# Patient Record
Sex: Female | Born: 1965 | Race: White | Hispanic: No | Marital: Married | Smoking: Never smoker
Health system: Southern US, Community
[De-identification: ages and names within clinical notes are randomized; demographics above are authoritative.]

## PROBLEM LIST (undated history)

## (undated) DIAGNOSIS — F410 Panic disorder [episodic paroxysmal anxiety] without agoraphobia: Secondary | ICD-10-CM

## (undated) DIAGNOSIS — I10 Essential (primary) hypertension: Secondary | ICD-10-CM

## (undated) DIAGNOSIS — Z8489 Family history of other specified conditions: Secondary | ICD-10-CM

## (undated) DIAGNOSIS — F419 Anxiety disorder, unspecified: Secondary | ICD-10-CM

## (undated) DIAGNOSIS — C801 Malignant (primary) neoplasm, unspecified: Secondary | ICD-10-CM

## (undated) DIAGNOSIS — I739 Peripheral vascular disease, unspecified: Secondary | ICD-10-CM

## (undated) DIAGNOSIS — J302 Other seasonal allergic rhinitis: Secondary | ICD-10-CM

## (undated) DIAGNOSIS — M1990 Unspecified osteoarthritis, unspecified site: Secondary | ICD-10-CM

## (undated) DIAGNOSIS — K219 Gastro-esophageal reflux disease without esophagitis: Secondary | ICD-10-CM

## (undated) HISTORY — PX: ANTERIOR FUSION CERVICAL SPINE: SUR626

## (undated) HISTORY — DX: Essential (primary) hypertension: I10

## (undated) HISTORY — PX: BREAST SURGERY: SHX581

---

## 1997-05-10 ENCOUNTER — Ambulatory Visit (HOSPITAL_COMMUNITY): Admission: RE | Admit: 1997-05-10 | Discharge: 1997-05-10 | Payer: Self-pay | Admitting: Obstetrics & Gynecology

## 1998-04-28 ENCOUNTER — Other Ambulatory Visit: Admission: RE | Admit: 1998-04-28 | Discharge: 1998-04-28 | Payer: Self-pay | Admitting: Obstetrics & Gynecology

## 1998-08-17 ENCOUNTER — Encounter (INDEPENDENT_AMBULATORY_CARE_PROVIDER_SITE_OTHER): Payer: Self-pay | Admitting: Specialist

## 1998-08-17 ENCOUNTER — Other Ambulatory Visit: Admission: RE | Admit: 1998-08-17 | Discharge: 1998-08-17 | Payer: Self-pay | Admitting: Gynecology

## 1999-03-09 ENCOUNTER — Other Ambulatory Visit: Admission: RE | Admit: 1999-03-09 | Discharge: 1999-03-09 | Payer: Self-pay | Admitting: Gynecology

## 1999-11-27 ENCOUNTER — Other Ambulatory Visit: Admission: RE | Admit: 1999-11-27 | Discharge: 1999-11-27 | Payer: Self-pay | Admitting: Obstetrics & Gynecology

## 2002-08-07 ENCOUNTER — Other Ambulatory Visit: Admission: RE | Admit: 2002-08-07 | Discharge: 2002-08-07 | Payer: Self-pay | Admitting: Obstetrics & Gynecology

## 2004-04-10 ENCOUNTER — Other Ambulatory Visit: Admission: RE | Admit: 2004-04-10 | Discharge: 2004-04-10 | Payer: Self-pay | Admitting: Obstetrics & Gynecology

## 2005-11-08 ENCOUNTER — Ambulatory Visit (HOSPITAL_COMMUNITY): Admission: RE | Admit: 2005-11-08 | Discharge: 2005-11-08 | Payer: Self-pay | Admitting: Obstetrics & Gynecology

## 2005-11-20 ENCOUNTER — Ambulatory Visit (HOSPITAL_COMMUNITY): Admission: RE | Admit: 2005-11-20 | Discharge: 2005-11-20 | Payer: Self-pay | Admitting: Obstetrics & Gynecology

## 2007-06-18 ENCOUNTER — Emergency Department (HOSPITAL_COMMUNITY): Admission: EM | Admit: 2007-06-18 | Discharge: 2007-06-18 | Payer: Self-pay | Admitting: Emergency Medicine

## 2007-06-19 ENCOUNTER — Ambulatory Visit (HOSPITAL_COMMUNITY): Admission: RE | Admit: 2007-06-19 | Discharge: 2007-06-19 | Payer: Self-pay | Admitting: Gastroenterology

## 2007-07-09 ENCOUNTER — Inpatient Hospital Stay (HOSPITAL_COMMUNITY): Admission: AD | Admit: 2007-07-09 | Discharge: 2007-07-13 | Payer: Self-pay | Admitting: Gastroenterology

## 2007-07-09 HISTORY — PX: CHOLECYSTECTOMY: SHX55

## 2007-07-10 ENCOUNTER — Encounter (INDEPENDENT_AMBULATORY_CARE_PROVIDER_SITE_OTHER): Payer: Self-pay | Admitting: General Surgery

## 2007-08-07 ENCOUNTER — Ambulatory Visit (HOSPITAL_COMMUNITY): Admission: RE | Admit: 2007-08-07 | Discharge: 2007-08-07 | Payer: Self-pay | Admitting: General Surgery

## 2008-01-09 HISTORY — PX: DIAGNOSTIC LAPAROSCOPY: SUR761

## 2008-05-25 ENCOUNTER — Encounter (INDEPENDENT_AMBULATORY_CARE_PROVIDER_SITE_OTHER): Payer: Self-pay | Admitting: Obstetrics & Gynecology

## 2008-05-25 ENCOUNTER — Observation Stay (HOSPITAL_COMMUNITY): Admission: AD | Admit: 2008-05-25 | Discharge: 2008-05-26 | Payer: Self-pay | Admitting: Obstetrics & Gynecology

## 2008-05-25 HISTORY — PX: DIAGNOSTIC LAPAROSCOPY: SUR761

## 2009-02-15 ENCOUNTER — Ambulatory Visit: Payer: Self-pay | Admitting: Internal Medicine

## 2009-12-15 ENCOUNTER — Observation Stay (HOSPITAL_COMMUNITY): Admission: EM | Admit: 2009-12-15 | Discharge: 2009-02-15 | Payer: Self-pay | Admitting: Emergency Medicine

## 2010-03-29 LAB — BASIC METABOLIC PANEL
Chloride: 102 mEq/L (ref 96–112)
GFR calc non Af Amer: >60 mL/min (ref >60)
Potassium: 2.9 mEq/L — ABNORMAL LOW (ref 3.5–5.1)
Sodium: 138 mEq/L (ref 135–145)

## 2010-03-29 LAB — DIFFERENTIAL
Eosinophils Absolute: 0.1 10*3/uL (ref 0.0–0.7)
Eosinophils Relative: 2 % (ref 0–5)
Lymphocytes Relative: 35 % (ref 12–46)
Lymphs Abs: 2.5 10*3/uL (ref 0.7–4.0)
Monocytes Absolute: 0.5 10*3/uL (ref 0.1–1.0)
Monocytes Relative: 7 % (ref 3–12)

## 2010-03-29 LAB — CK TOTAL AND CKMB (NOT AT ARMC)
CK, MB: 1 ng/mL (ref 0.3–4.0)
Relative Index: INVALID (ref 0.0–2.5)
Total CK: 98 U/L (ref 7–177)

## 2010-03-29 LAB — POCT CARDIAC MARKERS: Troponin i, poc: <0.05 ng/mL (ref 0.00–0.09)

## 2010-03-29 LAB — D-DIMER, QUANTITATIVE: D-Dimer, Quant: 0.27 ug/mL-FEU (ref 0.00–0.48)

## 2010-03-29 LAB — CBC
HCT: 37.3 % (ref 36.0–46.0)
Hemoglobin: 12.8 g/dL (ref 12.0–15.0)
MCV: 83.2 fL (ref 78.0–100.0)
RBC: 4.48 MIL/uL (ref 3.87–5.11)
WBC: 6.9 10*3/uL (ref 4.0–10.5)

## 2010-03-29 LAB — TSH: TSH: 2.143 u[IU]/mL (ref 0.350–4.500)

## 2010-03-29 LAB — TROPONIN I: Troponin I: 0.02 ng/mL (ref 0.00–0.06)

## 2010-04-18 LAB — CBC
HCT: 26.3 % — ABNORMAL LOW (ref 36.0–46.0)
HCT: 32.3 % — ABNORMAL LOW (ref 36.0–46.0)
Hemoglobin: 11.4 g/dL — ABNORMAL LOW (ref 12.0–15.0)
Hemoglobin: 9.2 g/dL — ABNORMAL LOW (ref 12.0–15.0)
Hemoglobin: 9.7 g/dL — ABNORMAL LOW (ref 12.0–15.0)
MCHC: 35.1 g/dL (ref 30.0–36.0)
MCHC: 35.3 g/dL (ref 30.0–36.0)
MCHC: 35.5 g/dL (ref 30.0–36.0)
MCV: 87 fL (ref 78.0–100.0)
MCV: 88.1 fL (ref 78.0–100.0)
Platelets: 201 10*3/uL (ref 150–400)
Platelets: 210 10*3/uL (ref 150–400)
RBC: 3.71 MIL/uL — ABNORMAL LOW (ref 3.87–5.11)
RDW: 15 % (ref 11.5–15.5)
RDW: 15.1 % (ref 11.5–15.5)
WBC: 8.3 10*3/uL (ref 4.0–10.5)

## 2010-05-23 NOTE — Op Note (Signed)
[name], [name]               ACCOUNT NO.:  [id number]   MEDICAL RECORD NO.:  [id number]          PATIENT TYPE:  INP   LOCATION:  5120                         FACILITY:  [facility]   PHYSICIAN:  Ollen Gross. Vernell Morgans, M.D. DATE OF BIRTH:  06-16-1965   DATE OF PROCEDURE:  07/10/2007  DATE OF DISCHARGE:                               OPERATIVE REPORT   PREOPERATIVE DIAGNOSIS:  Gallstones.   POSTOPERATIVE DIAGNOSIS:  Gallstones.   PROCEDURE:  Laparoscopic cholecystectomy with intraoperative  cholangiogram.   SURGEON:  Ollen Gross. Vernell Morgans, MD   ASSISTANT:  Marta Lamas. Lindie Spruce, MD   ANESTHESIA:  General endotracheal.   PROCEDURE:  After informed consent was obtained, the patient was brought  to the operating room and placed in the supine position on the operating  table.  After adequate induction of general anesthesia, the patient's  abdomen was prepped with Betadine and draped in usual sterile manner.  The area below the umbilicus was infiltrated with 0.25% Marcaine.  A  small incision was made with an 11-blade knife.  This incision was  carried down through the skin and subcutaneous tissue bluntly with a  hemostat and [employer] retractors until the linea alba was identified.  The linea alba was incised with a 15-blade knife and each side was  grasped Kocher clamps and elevated anteriorly.  The preperitoneal space  was then probed bluntly with a hemostat until the peritoneum was opened  access was gained to the abdominal cavity.  A 0-Vicryl pursestring  stitch was placed in the fascia around the opening.  A Hasson cannula  was placed through the opening and anchored in place at the previous  with 0-Vicryl pursestring stitch.  The abdomen was then insufflated with  carbon dioxide without difficulty.  The laparoscope was inserted through  the Hasson cannula and the right upper quadrant was inspected.  The dome  of the gallbladder and liver were readily identified.  The epigastric  region was  then infiltrated with 0.25% Marcaine.  A small incision was  made with a 15-blade knife.  A 10-mm port was placed bluntly through  this incision into the abdominal cavity under direct vision.  Sites were  then chosen laterally on the right side of the abdomen with placement of  5-mm ports.  Each of these areas were then infiltrated with 0.25%  Marcaine.  Small stab incisions were made with a 15-blade knife.  A 5-mm  ports were placed through these incisions into the abdominal cavity  under direct vision.  Blunt grasper was placed through the lateral-most  5-mm port and used to grasp dome of gallbladder and elevated anteriorly  and superiorly.  Another blunt grasper was placed through the other 5-mm  port and used to retract on the body and neck of the gallbladder.  Some  omental adhesions to the body gallbladder was taken down by blunt and  sharp dissection with the dissector and electrocautery.  Once this was  accomplished, the peritoneal reflection of the gallbladder neck was  opened with electrocautery.  Blunt dissection was then carried out in  this area until  the gallbladder neck cystic duct junction was readily  identified and a good window was created.  A single clip was placed in  the gallbladder neck.  A small ductotomy was made just below the clip.  A 14-gauge Angiocath was placed percutaneously through the anterior  abdominal wall under direct vision.  A Reddick cholangiogram catheter  was placed through the Angiocath and flushed.  The Reddick catheter was  then placed within the cystic duct and anchored in place with the clip.  Cholangiogram was obtained that showed no filling defects, good emptying  in duodenum and adequate length on the cystic duct, anchoring clip and  catheters were removed from the patient.  Three clips placed proximally  on the cystic duct and duct was divided between the 2 sets of clips.  Posterior to this, the cystic artery was identified and again  dissected  bluntly in a circumferential manner until a good window was created.  Two clips placed proximally and one distally on the artery and the  artery was divided between the two.  Next, a laparoscopic hook cautery  device was used to separate the gallbladder from liver bed.  Prior to  completely detaching the gallbladder from liver bed, the liver bed was  inspected and some small bleeding points were coagulated with  electrocautery until the area was completely hemostatic.  The  gallbladder was then detached from the rest away from liver bed without  difficulty.  A laparoscopic bag was inserted through the epigastric  port.  The gallbladder was placed in the bag and bag was sealed.  The  abdomen was then irrigated with copious amounts of saline to the  effluent was clear.  The laparoscope was then moved to the epigastric  port.  A gallbladder grasper was placed through the Hasson cannula used  to grasp and open the bag.  The bag with the gallbladder was removed  through the infraumbilical port without difficulty.  The fascial defect  was closed a previous used Vicryl pursestring stitch as well as with  another figure-of-eight 0-Vicryl stitch.  The rest of the ports were  removed under direct vision and found to be hemostatic.  The gas was  allowed to escape.  The skin incisions were all closed with interrupted  4-0 Monocryl and subcuticular stitches.  Dermabond dressings were  applied.  The patient tolerated the procedure well.  At the end of the  case all needle, sponge, and instrument counts were correct.  The  patient was then awakened and taken to recovery in stable condition.      Ollen Gross. Vernell Morgans, M.D.  Electronically Signed     PST/MEDQ  D:  07/12/2007  T:  07/13/2007  Job:  [id number]

## 2010-05-23 NOTE — Consult Note (Signed)
[name], [name]               ACCOUNT NO.:  [id number]   MEDICAL RECORD NO.:  [id number]          PATIENT TYPE:  INP   LOCATION:  5120                         FACILITY:  [facility]   PHYSICIAN:  Ollen Gross. Vernell Morgans, M.D. DATE OF BIRTH:  [birth year]   DATE OF CONSULTATION:  07/09/2007  DATE OF DISCHARGE:                                 CONSULTATION   REQUESTING PHYSICIAN:  Dr. Elnoria Howard.   CONSULTING SURGEON:  Dr. Carolynne Edouard.   REASON FOR CONSULTATION:  Symptomatic cholelithiasis.   HISTORY OF PRESENT ILLNESS:  This is a 45 year old white female with no  significant past medical history, who began having gallbladder attacks  in April of this year.  At that time, she tried to ignore these and kept  working.  However, her pain became bad enough that on June 18, 2007, she  presented to the emergency department, and her LFTs were increased with  a total bilirubin of approximately 2.3.  At that time, Dr. Elnoria Howard did a  ERCP and removed a common bile duct stone within the gallbladder.  Since  then, the patient has been seen by Dr. Daphine Deutscher at our office and an  outpatient elective cholecystectomy is currently being tried to set up.  However since that visit, the patient has been continuing to have an  increasing amount of pain.  She states that at this time she cannot work  with this much pain.  At that time, the patient called Dr. Elnoria Howard and due  to her increasing pain with nausea as well as the dry heave, she was  admitted.  At this time, the patient does not have any new labs.  We  were asked to see the patient for surgical intervention.   REVIEW OF SYSTEMS:  See HPI.  Otherwise all other systems are negative.   FAMILY HISTORY:  Noncontributory.   PAST MEDICAL HISTORY:  Recent diagnosis of hypertension.   PAST SURGICAL HISTORY:  1. 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  2. Two laparoscopies by her OB/GYN.   SOCIAL HISTORY:  The patient has been married for 18 years and does not  have  any children.  She denies tobacco, alcohol, or drugs.   ALLERGIES:  Appears to be NKDA.   MEDICATIONS:  1. The patient has been taking Percocet on a p.r.n. for this most      recent pain.  2. Phenergan as needed for nausea.   PHYSICAL EXAMINATION:  GENERAL:  This is a very pleasant 45 year old  white female who is well developed and well nourished lying in bed in no  acute distress.  VITAL SIGNS:  Temperature has not been taken yet, pulse of 58,  respirations were 16, blood pressure is 172/94.  EYES:  Sclerae noninjected.  Pupils are equal, round and reactive to  light.  EARS, NOSE, MOUTH, AND THROAT:  Ears and nose, no obvious masses or  lesions.  No rhinorrhea.  Mouth is pink and moist.  Throat shows no  exudate.  NECK:  Supple.  Trachea is midline.  No thyromegaly.  HEART:  Regular rate  and rhythm.  Normal S1 and S2.  No murmurs,  gallops, or rubs are noted.  +2 carotid and pedal pulses bilaterally.  LUNGS:  Clear to auscultation bilaterally with no wheezes, rhonchi, or  rales noted.  Respiratory effort is nonlabored.  CHEST:  Symmetrical.  ABDOMEN:  Soft and tender in the right upper quadrant.  The patient does  have active bowel sounds and is nondistended.  However, she is somewhat  obese.  Otherwise, the patient does not have any hernias or masses  noted.  However, she does have an infraumbilical scar from her prior  laparoscopies.  MUSCULOSKELETAL:  All 4 extremities are symmetrical with no cyanosis,  clubbing, or edema.  Skin is warm and dry with no obvious masses,  lesions, or rashes.  NEURO:  Cranial nerves 2 through 12 appear to be grossly intact.  Deep  tendon reflex exam is deferred at this time.  PSYCH:  The patient is alert and oriented x3 with an appropriate affect.   Labs and diagnostics are pending.   IMPRESSION:  1. Symptomatic cholelithiasis.  2. Status post recent endoscopic retrograde cholangiopancreatography      done on June 19, 2007.  3.  Hypertension.   PLAN:  At this time, we will allow the patient to have clear liquids  today.  However, if this begins to make her sick of her stomach, we will  back her off to n.p.o.  Otherwise will make the patient n.p.o. after  midnight in case surgical intervention can be done tomorrow.  Otherwise  at this time since the labs have been drawn, we will order CBC, CMET,  amylase, and lipase.  At that time if her liver function tests come back  normal, we will probably go ahead and proceed with a laparoscopic  cholecystectomy tomorrow.  However, if her LFTs come back abnormal, we  may need to get another ultrasound of the gallbladder, a HIDA scan or  possibly another ERCP pending the lab values.  Otherwise at this time,  we will go ahead and arrange for permit to be signed for a laparoscopic  cholecystectomy with intraoperative cholangiogram and possible open by  Dr. Carolynne Edouard.  Otherwise, I will also start the patient on Diovan 80 mg p.o.  daily for her hypertension.      Letha Cape, PA      Ollen Gross. Vernell Morgans, M.D.  Electronically Signed    KEO/MEDQ  D:  07/09/2007  T:  07/10/2007  Job:  [id number]   cc:   Jordan Hawks. Elnoria Howard, MD

## 2010-05-23 NOTE — H&P (Signed)
[name], [name]               ACCOUNT NO.:  [id number]   MEDICAL RECORD NO.:  [id number]          PATIENT TYPE:  INP   LOCATION:  5120                         FACILITY:  [facility]   PHYSICIAN:  Jordan Hawks. Elnoria Howard, MD    DATE OF BIRTH:  [birth year]   DATE OF ADMISSION:  07/09/2007  DATE OF DISCHARGE:                              HISTORY & PHYSICAL   REASON FOR ADMISSION:  Symptomatic gallstone, gallbladder pain.   SURGEON:  Thornton Park. Daphine Deutscher, MD   HISTORY OF PRESENT ILLNESS:  This is a 45 year old female, who initially  presented on June 18, 2007, with complaints of abdominal pain in the  right upper quadrant.  At that time, she presented to the emergency room  and was noted to have elevated transaminases consistent with an  obstructive picture.  A right upper quadrant ultrasound was performed  and was significant for gallstones, and subsequently, a surgical  consultation was requested from the ER.  At that time, Dr. Daphine Deutscher felt  that she would be better served with an ERCP before a laparoscopic  cholecystectomy.  Subsequently, she was sent to Dr. Haywood Pao office, and  she was evaluated, and subsequently, she underwent an ERCP, which was  performed without any complications.  At that time, there was no  evidence of any biliary ductal stones, and the CBD was noted to be of  normal caliber.  However, with contrast injection, a significant amount  of small stones were noted in the right upper quadrant.  All current  workup was negative for any evidence of chololithiasis, and after the  procedure, she did not have any complications from the ERCP; however,  she continued to complain of right upper quadrant pain.  She was  provided with some pain medications; unfortunately, they caused her  significant amount of side effects in regards to altered mental status  and with the use of medications, she was not able to work.  Subsequently, she was recommended that if her pain continued to worse,  then she could present to the emergency room for further evaluation and  treatment, and possibly advance her cholecystectomy time.  Arrangements  were being made for her to be seen by Dr. Daphine Deutscher much sooner than  previously scheduled, and she was able to be seen by him on July 07, 2007.  At that point, it was felt that there was no eminent need for a  cholecystectomy and she should be treated for reflux disease.  She  subsequently followed up with Dr. Elnoria Howard in the office and she was started  on Kapidex; however, there was a significant amount of right upper  quadrant tenderness and also associated nausea with her symptoms.  No  reports of any fever.  Blood work was obtained during this office visit  and was noted to be normal.  Upon followup via the phone, the patient  continued to complain of having right upper quadrant pain, she was not  able to function normally and subsequently, she was offered an admission  to the hospital to help expedite the removal of her gallbladder.   PAST  MEDICAL HISTORY:  As stated above.   PAST SURGICAL HISTORY:  As stated above.   FAMILY HISTORY:  Noncontributory.   SOCIAL HISTORY:  The patient works as a Nature conservation officer at [employer].  No  alcohol, tobacco, or illicit drug use.   REVIEW OF SYSTEMS:  As stated above in the history of present illness,  otherwise negative.   PHYSICAL EXAMINATION:  VITAL SIGNS:  Blood pressure is 156/98, heart  rate is 58, temperature is 97.6, and respirations 16.  GENERAL:  The patient is in no acute distress, but she is uncomfortable  in appearance.  HEENT:  Normocephalic and atraumatic.  Extraocular muscles intact.  NECK:  Supple.  No lymphadenopathy.  LUNGS:  Clear to auscultation bilaterally.  CARDIOVASCULAR:  Regular rate and rhythm.  ABDOMEN:  Obese, soft, and tender in the right upper quadrant and  epigastric region.  No rebound or rigidity.  Positive bowel sounds.  EXTREMITIES:  No clubbing, cyanosis, or edema.    LABORATORY DATA:  White blood cell count 6.1, hemoglobin 11.7, MCV is  83.2, and platelets 237.  Sodium 141, potassium 3.6, chloride 109, CO2  25, glucose 89, BUN 6, and creatinine 0.7.  Total bilirubin 1.2, alk  phos 89, AST 21, ALT 21, amylase is 23, and lipase is 21.   IMPRESSION:  Symptomatic cholelithiasis.  Because of the persistent pain  and her nausea types of symptoms, I feel that a cholecystectomy is  required at this time.  It does not appear that she can wait several  more weeks for her surgery.  Dr. Carolynne Edouard has consulted on the patient.  It  appears that she will undergo the surgery tomorrow.   PLAN:  1. Nothing per os after midnight.  2. Anticipate cholecystectomy tomorrow.      Jordan Hawks Elnoria Howard, MD  Electronically Signed     PDH/MEDQ  D:  07/09/2007  T:  07/10/2007  Job:  [id number]   cc:   Thornton Park Daphine Deutscher, MD  Ollen Gross. Vernell Morgans, M.D.

## 2010-05-23 NOTE — Discharge Summary (Signed)
[name], [name]               ACCOUNT NO.:  [id number]   MEDICAL RECORD NO.:  [id number]          PATIENT TYPE:  OUT   LOCATION:  XRAY                         FACILITY:  [facility]   PHYSICIAN:  Ollen Gross. Vernell Morgans, M.D. DATE OF BIRTH:  [birth year]   DATE OF ADMISSION:  08/07/2007  DATE OF DISCHARGE:                               DISCHARGE SUMMARY   DISCHARGE DIAGNOSES:  Cholelithiasis and cholecystitis.   SURGEON:  Ollen Gross. Vernell Morgans, MD   HISTORY OF PHYSICAL:  Please see the original history and physical for  full details.   HOSPITAL COURSE:  The patient was admitted to the hospital with  persistent right upper quadrant pain which was felt to be secondary to  symptomatic cholelithiasis.  She had undergone an ERCP several weeks ago  which was negative for any common bile duct stones.  Because of the  persistence of her pain and symptoms, she was admitted to the hospital  for emergent laparoscopic cholecystectomy.  The patient was evaluated by  Dr. Carolynne Edouard and it was felt that she would be a good candidate to undergo  the procedure.  She was subsequently taken for surgery on the day after  admission and then the surgery was performed without any complications.  After the surgery, she did complain of having a significant amount of  abdominal pain which was felt to be incisional in nature; however, on  postop day #2, she did complain of having a worsening of her abdominal  pain.  A CT scan was performed and was negative for any bile duct bleeds  or any other abnormalities.  On postop day #3, the patient felt much  better and her pain had resolved at that time and was felt that she  would be able to be discharged safely.  Blood work during this time was  also negative for any abnormalities.  The patient is follow up with Dr.  Carolynne Edouard as previously scheduled.      Jordan Hawks Elnoria Howard, MD   Electronically Signed     ______________________________  Ollen Gross. Vernell Morgans, M.D.    PDH/MEDQ  D:   08/07/2007  T:  08/08/2007  Job:  [id number]   cc:   Ollen Gross. Vernell Morgans, M.D.

## 2010-05-23 NOTE — Op Note (Signed)
NAMEDAIJANAE, RAFALSKI               ACCOUNT NO.:  [id number]   MEDICAL RECORD NO.:  [id number]          PATIENT TYPE:  INP   LOCATION:  9308                          FACILITY:  [facility]   PHYSICIAN:  Freddy Finner, M.D.   DATE OF BIRTH:  11/07/1965   DATE OF PROCEDURE:  05/25/2008  DATE OF DISCHARGE:                               OPERATIVE REPORT   PREOPERATIVE DIAGNOSIS:  Ruptured ectopic pregnancy.   POSTOPERATIVE DIAGNOSIS:  Ruptured ectopic pregnancy with ruptured right  fallopian tube from ectopic pregnancy and intra-abdominal hemorrhage.   OPERATION PERFORMED:  Laparoscopy, partial right salpingectomy with  resection of ectopic pregnancy, irrigation and aspiration of bloody  clots from the abdominal and pelvic cavities.   SURGEON:  Freddy Finner, M.D.   ANESTHESIA:  General endotracheal.   ESTIMATED BLOOD LOSS:  Estimated total blood loss including that in the  abdomen was in the range of 300 to 500 mL.   INTRAOPERATIVE COMPLICATIONS:  None.   INDICATIONS FOR PROCEDURE:  The patient is a 45 year old who was found  to have a positive early pregnancy test and had an ultrasound on May 23, 2008 which showed an empty uterus, showed a cystic structure in the  right adnexa, most consistent with a corpus luteum with no definite  evidence of gestational sac either in the uterus or the tube.  She was  given an ectopic warning.  She presented on the evening of surgery with  severe abdominal pain which started lower and generalized to the upper  abdomen to the point that she was having some difficulty breathing.  __________an abdominopelvic ultrasound showed a large amount of clots  and blood in the abdomen.  It was elected to bring her to the operating  room.   DESCRIPTION OF PROCEDURE:  She was there placed under adequate general  endotracheal anesthesia, placed in dorsal lithotomy position.  The  abdomen, perineum and vagina were prepped in the usual fashion.  Hulka  tenaculum  was attached to the cervix under direct visualization.  Foley  catheter was placed to evacuate the bladder.  Sterile drapes were  applied.  Two incisions were made, one in the umbilicus and one just  above the symphysis.  An 11 mm bladeless disposable trocar was  introduced at the umbilicus.  Direct inspection revealed adequate  placement with no evidence of injury on entry.  Pneumoperitoneum was  allowed to accumulate.  A second incision was made just above the  symphysis and through this, a 10-11 bladed trocar was also placed under  direct visualization.  A large suction coil-tip was used to evacuate the  bloody material.  The fallopian tube on the right was grasped with a  ring forceps and elevated.  It was obvious there was an ampullary  ruptured ectopic pregnancy with active arterial bleeding.  Bipolar  coagulation forceps were used through the operating channel of the  laparoscope to seal and divide the tube above and below the ectopic with  resection of the distal half of the tube.  This was accomplished without  difficulty.  Sharp division freed the  tube and it was delivered through  the operating channel of the laparoscope.  A repeated inspection  revealed complete hemostasis at this site.  The right ovary itself  appeared to be normal.  The uterus seemed to be slightly enlarged but  overall the contour was normal.  The left fallopian tube and ovary were  inspected and were normal.  Two more liters of irrigating solution were  then used to irrigate the abdominal cavity and to remove blood and  clots.  80% or more of the bloody material was removed.  Hemostasis was  complete.  At this point the irrigating solution was aspirated from the  abdomen.  The instruments were removed.  The skin incisions were closed  with interrupted subcuticular sutures of 3-0 Dexon.  Steri-Strips were  applied to the lower incision.  0.5% Marcaine was injected into the  incision sites for postoperative  analgesia.  The patient was awakened  and taken to the recovery room in satisfactory condition.  She will be  admitted overnight for observation with likely discharge on May 26, 2008.      Freddy Finner, M.D.  Electronically Signed     WRN/MEDQ  D:  05/26/2008  T:  05/26/2008  Job:  [id number]

## 2010-05-23 NOTE — Discharge Summary (Signed)
[name], [name]               ACCOUNT NO.:  [id number]   MEDICAL RECORD NO.:  [id number]          PATIENT TYPE:  INP   LOCATION:  5120                         FACILITY:  [facility]   PHYSICIAN:  Jordan Hawks. Elnoria Howard, MD    DATE OF BIRTH:  [birth year]   DATE OF ADMISSION:  07/09/2007  DATE OF DISCHARGE:  07/13/2007                               DISCHARGE SUMMARY   DISCHARGE DIAGNOSIS:  Symptomatic cholelithiasis.   SURGEON:  Ollen Gross. Vernell Morgans, MD   HISTORY OF PRESENT ILLNESS:  Please see the original H and P for full  details.   HOSPITAL COURSE:  The patient was admitted to the hospital with  symptomatic gallbladder pain.  She was evaluated by surgery.  On the  second hospital day, she was taken to the operating room for a  laparoscopic cholecystectomy, which was performed without any  complications.  Postoperatively, she had a significant amount of right  lower quadrant pain, which was more than expected from a laparoscopic  cholecystectomy.  A HIDA scan was ordered and this was negative for any  bile leak.  Because of the persistence of her abdominal pain, a CT scan  of abdomen was also performed.  The CT scan was also negative for any  abnormalities.  Spontaneously, the pain did improve on its own and her  pain medication requirements markedly decreased.  She was also able to  tolerate p.o. in the meantime and ambulate without any difficulty.  On  the day of discharge, the patient had incisional pain but it was  markedly improved from her prior days from her surgery.   PLAN:  Plan at this time is to follow up with surgery, with Dr. Carolynne Edouard,  and pain medications per the Surgical Service.      Jordan Hawks Elnoria Howard, MD  Electronically Signed     PDH/MEDQ  D:  07/13/2007  T:  07/13/2007  Job:  [id number]   cc:   Ollen Gross. Vernell Morgans, M.D.

## 2010-10-05 LAB — COMPREHENSIVE METABOLIC PANEL
ALT: 195 — ABNORMAL HIGH
ALT: 24
AST: 500 — ABNORMAL HIGH
AST: 22
AST: 28
Albumin: 3 — ABNORMAL LOW
Albumin: 3.4 — ABNORMAL LOW
Albumin: 3.8
Alkaline Phosphatase: 159 — ABNORMAL HIGH
Alkaline Phosphatase: 89
BUN: 2 — ABNORMAL LOW
BUN: 5 — ABNORMAL LOW
BUN: 6
CO2: 23
CO2: 25
Calcium: 9.1
Calcium: 8.9
Calcium: 9.5
Chloride: 104
Chloride: 106
Chloride: 109
Creatinine, Ser: 0.73
Creatinine, Ser: 0.79
Creatinine, Ser: 0.83
GFR calc Af Amer: >60
GFR calc Af Amer: >60
GFR calc Af Amer: >60
GFR calc non Af Amer: >60
GFR calc non Af Amer: >60
Glucose, Bld: 119 — ABNORMAL HIGH
Glucose, Bld: 89
Glucose, Bld: 90
Potassium: 3.8
Potassium: 3.6
Sodium: 139
Sodium: 139
Sodium: 140
Total Bilirubin: 0.9
Total Bilirubin: 1.2
Total Bilirubin: 1.6 — ABNORMAL HIGH
Total Protein: 6.5
Total Protein: 6.8

## 2010-10-05 LAB — CBC
HCT: 34.7 — ABNORMAL LOW
HCT: 36.1
HCT: 37.7
Hemoglobin: 13.8
Hemoglobin: 11.7 — ABNORMAL LOW
Hemoglobin: 12
Hemoglobin: 12.9
MCHC: 34.3
MCHC: 33.7
MCHC: 34.1
MCV: 83.6
MCV: 84
MCV: 84.8
Platelets: 230
Platelets: 273
RBC: 4.93
RBC: 4.17
RDW: 15.2
RDW: 15.3
WBC: 6.5
WBC: 5.6
WBC: 5.8
WBC: 6.1

## 2010-10-05 LAB — DIFFERENTIAL
Basophils Relative: 0
Eosinophils Absolute: 0.1
Eosinophils Absolute: 0.1
Eosinophils Relative: 1
Eosinophils Relative: 2
Lymphs Abs: 1.4
Lymphs Abs: 2.5
Monocytes Relative: 9
Monocytes Relative: 8
Neutrophils Relative %: 57

## 2010-10-05 LAB — URINALYSIS, ROUTINE W REFLEX MICROSCOPIC
Glucose, UA: NEGATIVE
Ketones, ur: NEGATIVE
pH: 6.5

## 2010-10-05 LAB — AMYLASE: Amylase: 23 — ABNORMAL LOW

## 2010-10-05 LAB — LIPASE, BLOOD
Lipase: 18
Lipase: 21

## 2011-02-28 ENCOUNTER — Ambulatory Visit (INDEPENDENT_AMBULATORY_CARE_PROVIDER_SITE_OTHER): Payer: Self-pay | Admitting: Pulmonary Disease

## 2011-02-28 ENCOUNTER — Encounter: Payer: Self-pay | Admitting: Pulmonary Disease

## 2011-02-28 VITALS — BP 162/98 | HR 75 | Temp 99.2°F | Ht 66.5 in | Wt 242.6 lb

## 2011-02-28 DIAGNOSIS — R05 Cough: Secondary | ICD-10-CM

## 2011-02-28 NOTE — Patient Instructions (Signed)
You seem to have a post bronchitic cough this time different from your usual seasonal cough OK to take cough drops as needed DELSYM 2 tsp thrice daily x 2-3 weeks OK to take tessalon perles Qvar 80 1 puff twice daily -sample STOP taking Dulera Prednisone 20 mg x 1 week, then 10 mg x 1 week, then stop  Call back if no better in 1 week & we may have to use low dose codeine cough syrup

## 2011-02-28 NOTE — Assessment & Plan Note (Signed)
You seem to have a post bronchitic cough this time different from your usual seasonal cough OK to take cough drops as needed DELSYM 2 tsp thrice daily x 2-3 weeks OK to take tessalon perles Qvar 80 1 puff twice daily -sample STOP taking Dulera Prednisone 20 mg x 1 week, then 10 mg x 1 week, then stop  Call back if no better in 1 week & we may have to use low dose codeine cough syrup   

## 2011-02-28 NOTE — Progress Notes (Signed)
  Subjective:    Patient ID: [name], female    DOB: 1965/01/10, 46 y.o.   MRN: [id number]  HPI PCP - tisovec  45/F, never smoker for evaluation of chronic cough She has a seasonal cough once / twice a year that improves with zyrtec & tessalon. Shehas had this since her teens. Her last such episode was in nov that resolved with amoxcillin.  In the first week of Jan, she developed a sore throat & ear ache, her sister & mom were sick at the same time. This soon developed into a cough & hoarseness. She initially got IM solumedrol for this & Z-pak. When not improved, she was given dulera & avelox - hoarseness improved then got worse again. She c/o tremors & has stopped using dulera. She now has a mostly dry cough with minimal clear phlegm. This does wake her up from sleep. She gets through work with a bottle of water & vit C drops. CXr 2/13 wnl CT abdomen from '09 shows nml lung  Bases  - no bronchiectasis. CBC, BMET nml She denies childhood h/o asthma, no heartburn or sinus drainage    Review of Systems  Constitutional: Negative for fever and unexpected weight change.  HENT: Positive for ear pain and sore throat. Negative for nosebleeds, congestion, rhinorrhea, sneezing, trouble swallowing, dental problem, postnasal drip and sinus pressure.   Eyes: Negative for redness and itching.  Respiratory: Positive for cough. Negative for chest tightness, shortness of breath and wheezing.   Cardiovascular: Negative for palpitations and leg swelling.  Gastrointestinal: Negative for nausea and vomiting.  Genitourinary: Negative for dysuria.  Musculoskeletal: Negative for joint swelling.  Skin: Negative for rash.  Neurological: Positive for headaches.  Hematological: Does not bruise/bleed easily.  Psychiatric/Behavioral: Negative for dysphoric mood. The patient is not nervous/anxious.        Objective:   Physical Exam Gen. Pleasant, well-nourished, in no distress, normal affect ENT - no  lesions, no post nasal drip, no tonsillar enlargement Neck: No JVD, no thyromegaly, no carotid bruits Lungs: no use of accessory muscles, no dullness to percussion, clear without rales or rhonchi  Cardiovascular: Rhythm regular, heart sounds  normal, no murmurs or gallops, no peripheral edema Abdomen: soft and non-tender, no hepatosplenomegaly, BS normal. Musculoskeletal: No deformities, no cyanosis or clubbing Neuro:  alert, non focal        Assessment & Plan:

## 2011-03-01 ENCOUNTER — Telehealth: Payer: Self-pay | Admitting: Pulmonary Disease

## 2011-03-01 MED ORDER — PREDNISONE 10 MG PO TABS: ORAL_TABLET | ORAL | Status: DC

## 2011-03-01 NOTE — Telephone Encounter (Signed)
Called and spoke with pt. Pt states she was seen yesterday by RA and prescribed Prednisone but rx wasn't at pharmacy.   Reviewed pt's chart and rx was never sent- therefore sent rx to pt's pharmacy- [facility] on [address].

## 2011-03-05 ENCOUNTER — Telehealth: Payer: Self-pay | Admitting: Pulmonary Disease

## 2011-03-05 NOTE — Telephone Encounter (Signed)
I spoke with the [name] and she states her cough is 75% better. She is still on 2 tabs a day of prednisone a day.  She just wanted to give Dr. Vassie Loll an update. I will forward the message. Carron Curie, CMA

## 2011-03-06 NOTE — Telephone Encounter (Signed)
ok 

## 2011-03-22 ENCOUNTER — Ambulatory Visit: Payer: 59 | Admitting: Adult Health

## 2011-05-07 ENCOUNTER — Ambulatory Visit (INDEPENDENT_AMBULATORY_CARE_PROVIDER_SITE_OTHER): Payer: 59 | Admitting: Pulmonary Disease

## 2011-05-07 ENCOUNTER — Telehealth: Payer: Self-pay | Admitting: Pulmonary Disease

## 2011-05-07 ENCOUNTER — Encounter: Payer: Self-pay | Admitting: Pulmonary Disease

## 2011-05-07 VITALS — BP 150/102 | HR 89 | Temp 98.4°F | Ht 66.0 in | Wt 248.0 lb

## 2011-05-07 DIAGNOSIS — R05 Cough: Secondary | ICD-10-CM

## 2011-05-07 MED ORDER — TRAMADOL HCL 50 MG PO TABS: 50.0000 mg | ORAL_TABLET | Freq: Four times a day (QID) | ORAL | Status: AC | PRN

## 2011-05-07 NOTE — Assessment & Plan Note (Addendum)
The patient has a chronic cough that improved from her last visit here, but now has re\re escalated over the last 5 days.  She is not able to identify a precipitating event.  Her cough is clearly coming from her upper airway, and she is having cough paroxysms leading to significant regurgitation of stomach contents.  She also describes difficulty swallowing, and a feeling that her "food gets stuck".  She is also having some postnasal drip, but nothing to suggest definite sinusitis.  Her lungs are clear today, and she denies any increased shortness of breath when she is not coughing.  I would like to treat her aggressively for upper airway causes of cough, including an antihistamine, proton pump inhibitor, and behavioral therapies for cyclical coughing.  She unfortunately has complete intolerance for codeine and codeine-containing medications, and therefore will try tramadol to see if this helps.

## 2011-05-07 NOTE — Patient Instructions (Signed)
Take dexilant 60mg  one each day until followup with Dr. Vassie Loll (for possible acid reflux). Take chlorpheniramine over the counter 8mg  at bedtime and lunch until followup visit. Can take tessalon pearls as directed Can take tramadol 50mg  one every 6 hrs for severe coughing. No singing, yelling, minimize voice use as much as possible No throat clearing!! followup with Dr. Vassie Loll in 1-2 weeks.

## 2011-05-07 NOTE — Progress Notes (Signed)
  Subjective:    Patient ID: [name], female    DOB: 24-Oct-1965, 46 y.o.   MRN: [id number]  HPI The patient comes in today for an acute sick visit related to chronic cough.  This has been an ongoing issue for her, and she was last seen in February and treated with various medications that did help.  She had a return of her cough last week, and it has been escalating in severity since that time.  She is clearly having cyclical coughing, almost to the point of regurgitation.  She does describe some swallowing issues as well.  She has postnasal drip, but denies nasal purulence.  She does have a globus sensation and also severe hoarseness.  She does not have any chest congestion or shortness of breath.   Review of Systems  Constitutional: Negative for fever and unexpected weight change.  HENT: Positive for trouble swallowing, voice change and postnasal drip. Negative for ear pain, nosebleeds, congestion, sore throat, rhinorrhea, sneezing, dental problem and sinus pressure.   Eyes: Negative for redness and itching.  Respiratory: Positive for cough. Negative for chest tightness, shortness of breath and wheezing.   Cardiovascular: Negative for palpitations and leg swelling.  Gastrointestinal: Positive for vomiting. Negative for nausea.  Genitourinary: Negative for dysuria.  Musculoskeletal: Negative for joint swelling.  Skin: Negative for rash.  Neurological: Negative for headaches.  Hematological: Does not bruise/bleed easily.  Psychiatric/Behavioral: Negative for dysphoric mood. The patient is not nervous/anxious.        Objective:   Physical Exam Obese female in no acute distress Nose with mild edema of nasal mucosa, but no purulence Oropharynx with the suggestion of postnasal drip Chest totally clear to auscultation, no wheezing Cardiac exam with regular rate and rhythm Lower extremities without edema, no cyanosis Alert and oriented, moves all 4 extremities.       Assessment &  Plan:

## 2011-05-07 NOTE — Telephone Encounter (Signed)
I spoke with pt and she c/o cough is worse and is getting up green phlem, loss of voice. Since RA is out of office pt is coming in to see [facility] today at 1:30 for an evaluation. Nothing further was needed

## 2011-05-22 ENCOUNTER — Ambulatory Visit (INDEPENDENT_AMBULATORY_CARE_PROVIDER_SITE_OTHER): Payer: 59 | Admitting: Adult Health

## 2011-05-22 ENCOUNTER — Ambulatory Visit: Payer: 59 | Admitting: Pulmonary Disease

## 2011-05-22 ENCOUNTER — Encounter: Payer: Self-pay | Admitting: Adult Health

## 2011-05-22 VITALS — BP 132/70 | HR 77 | Temp 98.2°F | Ht 66.0 in | Wt 243.2 lb

## 2011-05-22 DIAGNOSIS — R05 Cough: Secondary | ICD-10-CM

## 2011-05-22 NOTE — Patient Instructions (Signed)
Take Dexilant or Prilosec 20mg  daily for 6 weeks . Chlorpheniramine over the counter 8mg  at bedtime and As needed  For drainage until cough stops.  Tessalon pearls As needed  Cough  Can take tramadol 50mg  one every 6 hrs for severe coughing. No singing, yelling, minimize voice use as much as possible No throat clearing!! No mInts  Delsym 2 tsp Twice daily  As needed  Cough  follow up Dr. Vassie Loll  In 2 months and As needed

## 2011-05-22 NOTE — Assessment & Plan Note (Signed)
Upper airway cough -improving w/ GERD and AR prevention regimen  Plan;   Take Dexilant or Prilosec 20mg  daily for 6 weeks . Chlorpheniramine over the counter 8mg  at bedtime and As needed  For drainage until cough stops.  Tessalon pearls As needed  Cough  Can take tramadol 50mg  one every 6 hrs for severe coughing. No singing, yelling, minimize voice use as much as possible No throat clearing!! No mInts  Delsym 2 tsp Twice daily  As needed  Cough  follow up Dr. Vassie Loll  In 2 months and As needed

## 2011-05-22 NOTE — Progress Notes (Signed)
Subjective:    Patient ID: [name], female    DOB: 1965/08/10, 46 y.o.   MRN: [id number]  HPI 45/F, never smoker for evaluation of chronic cough She has a seasonal cough once / twice a year that improves with zyrtec & tessalon. Shehas had this since her teens. Her last such episode was in nov that resolved with amoxcillin.  In the first week of Jan, she developed a sore throat & ear ache, her sister & mom were sick at the same time. This soon developed into a cough & hoarseness. She initially got IM solumedrol for this & Z-pak. When not improved, she was given dulera & avelox - hoarseness improved then got worse again. She c/o tremors & has stopped using dulera. She now has a mostly dry cough with minimal clear phlegm. This does wake her up from sleep. She gets through work with a bottle of water & vit C drops. CXr 2/13 wnl CT abdomen from '09 shows nml lung  Bases  - no bronchiectasis. CBC, BMET nml She denies childhood h/o asthma, no heartburn or sinus drainage >>steroid taper , cough suppression rx   05/22/2011 Follow up  Seen for an acute cough flare 2 weeks ago , tx w/ cyclical regimen and PPI  She feels a lot better. Cough is almost gone . Has some drainage.  No hemoptysis or chest pain  No fever or discolored mucus.       Review of Systems Constitutional:   No  weight loss, night sweats,  Fevers, chills, fatigue, or  lassitude.  HEENT:   No headaches,  Difficulty swallowing,  Tooth/dental problems, or  Sore throat,                No sneezing, itching, ear ache,  +nasal congestion, post nasal drip,   CV:  No chest pain,  Orthopnea, PND, swelling in lower extremities, anasarca, dizziness, palpitations, syncope.   GI  No heartburn, indigestion, abdominal pain, nausea, vomiting, diarrhea, change in bowel habits, loss of appetite, bloody stools.   Resp: No shortness of breath with exertion or at rest.    No coughing up of blood.  No change in color of mucus.  No wheezing.   No chest wall deformity  Skin: no rash or lesions.  GU: no dysuria, change in color of urine, no urgency or frequency.  No flank pain, no hematuria   MS:  No joint pain or swelling.  No decreased range of motion.  No back pain.  Psych:  No change in mood or affect. No depression or anxiety.  No memory loss.         Objective:   Physical Exam  GEN: A/Ox3; pleasant , NAD, well nourished   HEENT:  [place]/AT,  EACs-clear, TMs-wnl, NOSE-clear drainage , THROAT-clear, no lesions, no postnasal drip or exudate noted.   NECK:  Supple w/ fair ROM; no JVD; normal carotid impulses w/o bruits; no thyromegaly or nodules palpated; no lymphadenopathy.  RESP  Clear  P & A; w/o, wheezes/ rales/ or rhonchi.no accessory muscle use, no dullness to percussion  CARD:  RRR, no m/r/g  , no peripheral edema, pulses intact, no cyanosis or clubbing.  GI:   Soft & nt; nml bowel sounds; no organomegaly or masses detected.  Musco: Warm bil, no deformities or joint swelling noted.   Neuro: alert, no focal deficits noted.    Skin: Warm, no lesions or rashes        Assessment & Plan:

## 2011-07-23 ENCOUNTER — Ambulatory Visit: Payer: 59 | Admitting: Pulmonary Disease

## 2012-06-17 ENCOUNTER — Other Ambulatory Visit: Payer: Self-pay | Admitting: Obstetrics & Gynecology

## 2012-06-17 DIAGNOSIS — R928 Other abnormal and inconclusive findings on diagnostic imaging of breast: Secondary | ICD-10-CM

## 2012-06-24 ENCOUNTER — Ambulatory Visit
Admission: RE | Admit: 2012-06-24 | Discharge: 2012-06-24 | Disposition: A | Discharge status: Home / Self Care | Payer: 59 | Source: Ambulatory Visit | Attending: Obstetrics & Gynecology | Admitting: Obstetrics & Gynecology

## 2012-06-24 ENCOUNTER — Other Ambulatory Visit: Payer: Self-pay | Admitting: Obstetrics & Gynecology

## 2012-06-24 ENCOUNTER — Ambulatory Visit: Payer: 59

## 2012-06-24 DIAGNOSIS — R928 Other abnormal and inconclusive findings on diagnostic imaging of breast: Secondary | ICD-10-CM

## 2012-06-25 ENCOUNTER — Ambulatory Visit
Admission: RE | Admit: 2012-06-25 | Discharge: 2012-06-25 | Disposition: A | Discharge status: Home / Self Care | Payer: 59 | Source: Ambulatory Visit | Attending: Obstetrics & Gynecology | Admitting: Obstetrics & Gynecology

## 2012-06-25 DIAGNOSIS — R928 Other abnormal and inconclusive findings on diagnostic imaging of breast: Secondary | ICD-10-CM

## 2012-07-07 ENCOUNTER — Ambulatory Visit (INDEPENDENT_AMBULATORY_CARE_PROVIDER_SITE_OTHER): Payer: 59 | Admitting: General Surgery

## 2012-07-07 ENCOUNTER — Encounter (INDEPENDENT_AMBULATORY_CARE_PROVIDER_SITE_OTHER): Payer: Self-pay | Admitting: General Surgery

## 2012-07-07 VITALS — BP 146/90 | HR 86 | Resp 16 | Ht 66.0 in | Wt 226.2 lb

## 2012-07-07 DIAGNOSIS — D241 Benign neoplasm of right breast: Secondary | ICD-10-CM

## 2012-07-07 DIAGNOSIS — D249 Benign neoplasm of unspecified breast: Secondary | ICD-10-CM

## 2012-07-07 NOTE — Patient Instructions (Signed)
Plan for right breast wire localized lumpectomy 

## 2012-07-07 NOTE — Progress Notes (Signed)
Subjective:     Patient ID: [name], female   DOB: 07/31/1965, 47 y.o.   MRN: [id number]  HPI We're asked to see the patient in consultation by Dr. Jean Rosenthal to evaluate her for a right breast papilloma. The patient is a 47 year old white female who recently went for a routine screening mammogram. At that time she was not having any breast pain or discharge. An abnormality was noted on her mammogram in the 9 o'clock position of the right breast. This was biopsied and came back as a papilloma. She has no personal history of breast problems. She does have some family history of breast cancer in a maternal aunt and 2 maternal cousins  Review of Systems  Constitutional: Negative.   HENT: Negative.   Eyes: Negative.   Respiratory: Negative.   Cardiovascular: Negative.   Gastrointestinal: Negative.   Endocrine: Negative.   Genitourinary: Negative.   Musculoskeletal: Negative.   Skin: Negative.   Allergic/Immunologic: Negative.   Neurological: Negative.   Hematological: Negative.   Psychiatric/Behavioral: Negative.        Objective:   Physical Exam  Constitutional: She is oriented to person, place, and time. She appears well-developed and well-nourished.  HENT:  Head: Normocephalic and atraumatic.  Eyes: Conjunctivae and EOM are normal. Pupils are equal, round, and reactive to light.  Neck: Normal range of motion. Neck supple.  Cardiovascular: Normal rate, regular rhythm and normal heart sounds.   Pulmonary/Chest: Effort normal and breath sounds normal.  There is no palpable mass in either breast. There is no palpable axillary supraclavicular or cervical lymphadenopathy.  Abdominal: Soft. Bowel sounds are normal. She exhibits no mass. There is no tenderness.  Musculoskeletal: Normal range of motion.  Lymphadenopathy:    She has no cervical adenopathy.  Neurological: She is alert and oriented to person, place, and time.  Skin: Skin is warm and dry.  Psychiatric: She has a normal  mood and affect. Her behavior is normal.       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 Because of this finding increases her risk of breast cancer I think he would be reasonable to do a lumpectomy to this area to make sure it is removed and make sure there is nothing else going on and that duct system in that location. I have discussed with her in detail the risks and benefits of the operation to do this as well as some of the technical aspects and she understands and wishes to proceed     Plan:     Plan for right breast wire localized lumpectomy

## 2012-07-24 ENCOUNTER — Encounter (HOSPITAL_BASED_OUTPATIENT_CLINIC_OR_DEPARTMENT_OTHER): Payer: Self-pay | Admitting: *Deleted

## 2012-07-24 NOTE — Progress Notes (Signed)
To come in for [id number]  

## 2012-07-25 ENCOUNTER — Encounter (HOSPITAL_BASED_OUTPATIENT_CLINIC_OR_DEPARTMENT_OTHER): Admission: RE | Admit: 2012-07-25 | Payer: Managed Care, Other (non HMO) | Source: Ambulatory Visit

## 2012-07-25 LAB — BASIC METABOLIC PANEL
BUN: 13 mg/dL (ref 6–23)
Chloride: 104 mEq/L (ref 96–112)
Creatinine, Ser: 0.66 mg/dL (ref 0.50–1.10)
GFR calc Af Amer: >90 mL/min (ref >90)
GFR calc non Af Amer: >90 mL/min (ref >90)
Glucose, Bld: 92 mg/dL (ref 70–99)
Potassium: 3.9 mEq/L (ref 3.5–5.1)

## 2012-07-25 MED ORDER — ALBUTEROL SULFATE (5 MG/ML) 0.5% IN NEBU
INHALATION_SOLUTION | RESPIRATORY_TRACT | Status: AC
  Filled 2012-07-25: qty 0.5

## 2012-07-29 ENCOUNTER — Encounter (HOSPITAL_BASED_OUTPATIENT_CLINIC_OR_DEPARTMENT_OTHER)
Admission: RE | Admit: 2012-07-29 | Discharge: 2012-07-29 | Disposition: A | Discharge status: Home / Self Care | Payer: Managed Care, Other (non HMO) | Source: Ambulatory Visit | Attending: General Surgery | Admitting: General Surgery

## 2012-07-30 ENCOUNTER — Encounter (HOSPITAL_BASED_OUTPATIENT_CLINIC_OR_DEPARTMENT_OTHER): Payer: Self-pay | Admitting: Anesthesiology

## 2012-07-30 ENCOUNTER — Ambulatory Visit (HOSPITAL_BASED_OUTPATIENT_CLINIC_OR_DEPARTMENT_OTHER): Payer: Managed Care, Other (non HMO) | Admitting: Anesthesiology

## 2012-07-30 ENCOUNTER — Ambulatory Visit
Admission: RE | Admit: 2012-07-30 | Discharge: 2012-07-30 | Disposition: A | Discharge status: Home / Self Care | Payer: Managed Care, Other (non HMO) | Source: Ambulatory Visit | Attending: General Surgery | Admitting: General Surgery

## 2012-07-30 ENCOUNTER — Ambulatory Visit (HOSPITAL_BASED_OUTPATIENT_CLINIC_OR_DEPARTMENT_OTHER)
Admission: RE | Admit: 2012-07-30 | Discharge: 2012-07-30 | Disposition: A | Discharge status: Home / Self Care | Payer: Managed Care, Other (non HMO) | Source: Ambulatory Visit | Attending: General Surgery | Admitting: General Surgery

## 2012-07-30 ENCOUNTER — Encounter (HOSPITAL_BASED_OUTPATIENT_CLINIC_OR_DEPARTMENT_OTHER)
Admission: RE | Disposition: A | Discharge status: Home / Self Care | Payer: Self-pay | Source: Ambulatory Visit | Attending: General Surgery

## 2012-07-30 ENCOUNTER — Encounter (HOSPITAL_BASED_OUTPATIENT_CLINIC_OR_DEPARTMENT_OTHER): Payer: Self-pay | Admitting: *Deleted

## 2012-07-30 DIAGNOSIS — D249 Benign neoplasm of unspecified breast: Secondary | ICD-10-CM | POA: Insufficient documentation

## 2012-07-30 DIAGNOSIS — N6089 Other benign mammary dysplasias of unspecified breast: Secondary | ICD-10-CM

## 2012-07-30 DIAGNOSIS — Z803 Family history of malignant neoplasm of breast: Secondary | ICD-10-CM | POA: Insufficient documentation

## 2012-07-30 DIAGNOSIS — D241 Benign neoplasm of right breast: Secondary | ICD-10-CM

## 2012-07-30 HISTORY — DX: Other seasonal allergic rhinitis: J30.2

## 2012-07-30 HISTORY — PX: BREAST LUMPECTOMY WITH NEEDLE LOCALIZATION: SHX5759

## 2012-07-30 SURGERY — BREAST LUMPECTOMY WITH NEEDLE LOCALIZATION
Anesthesia: General | Site: Breast | Laterality: Right | Wound class: Clean

## 2012-07-30 MED ORDER — HYDROMORPHONE HCL PF 1 MG/ML IJ SOLN: 0.2500 mg | INTRAMUSCULAR | Status: DC | PRN

## 2012-07-30 MED ORDER — OXYCODONE HCL 5 MG PO TABS
5.0000 mg | ORAL_TABLET | ORAL | Status: AC | PRN
  Administered 2012-07-30: 5 mg via ORAL

## 2012-07-30 MED ORDER — HYDROCODONE-ACETAMINOPHEN 5-325 MG PO TABS: 1.0000 | ORAL_TABLET | Freq: Four times a day (QID) | ORAL | Status: DC | PRN

## 2012-07-30 MED ORDER — CHLORHEXIDINE GLUCONATE 4 % EX LIQD: 1.0000 "application " | Freq: Once | CUTANEOUS | Status: DC

## 2012-07-30 MED ORDER — BUPIVACAINE HCL (PF) 0.25 % IJ SOLN
INTRAMUSCULAR | Status: DC | PRN
  Administered 2012-07-30: 19 mL

## 2012-07-30 MED ORDER — PROMETHAZINE HCL 25 MG/ML IJ SOLN: 6.2500 mg | INTRAMUSCULAR | Status: DC | PRN

## 2012-07-30 MED ORDER — CEFAZOLIN SODIUM-DEXTROSE 2-3 GM-% IV SOLR
2.0000 g | INTRAVENOUS | Status: AC
  Administered 2012-07-30: 2 g via INTRAVENOUS

## 2012-07-30 MED ORDER — MIDAZOLAM HCL 5 MG/5ML IJ SOLN
INTRAMUSCULAR | Status: DC | PRN
  Administered 2012-07-30: 2 mg via INTRAVENOUS

## 2012-07-30 MED ORDER — DEXAMETHASONE SODIUM PHOSPHATE 4 MG/ML IJ SOLN
INTRAMUSCULAR | Status: DC | PRN
  Administered 2012-07-30: 10 mg via INTRAVENOUS

## 2012-07-30 MED ORDER — FENTANYL CITRATE 0.05 MG/ML IJ SOLN
INTRAMUSCULAR | Status: DC | PRN
  Administered 2012-07-30: 50 ug via INTRAVENOUS
  Administered 2012-07-30: 25 ug via INTRAVENOUS

## 2012-07-30 MED ORDER — GLYCOPYRROLATE 0.2 MG/ML IJ SOLN
INTRAMUSCULAR | Status: DC | PRN
  Administered 2012-07-30: 0.2 mg via INTRAVENOUS

## 2012-07-30 MED ORDER — TRAMADOL HCL 50 MG PO TABS: 50.0000 mg | ORAL_TABLET | Freq: Four times a day (QID) | ORAL | Status: DC | PRN

## 2012-07-30 MED ORDER — LACTATED RINGERS IV SOLN
INTRAVENOUS | Status: DC
  Administered 2012-07-30: 09:00:00 via INTRAVENOUS

## 2012-07-30 MED ORDER — LIDOCAINE HCL (CARDIAC) 20 MG/ML IV SOLN
INTRAVENOUS | Status: DC | PRN
  Administered 2012-07-30: 80 mg via INTRAVENOUS

## 2012-07-30 MED ORDER — PROPOFOL 10 MG/ML IV BOLUS
INTRAVENOUS | Status: DC | PRN
  Administered 2012-07-30: 200 mg via INTRAVENOUS

## 2012-07-30 SURGICAL SUPPLY — 41 items
ADH SKN CLS APL DERMABOND .7 (GAUZE/BANDAGES/DRESSINGS) ×1
BLADE SURG 10 STRL SS (BLADE) ×2 IMPLANT
BLADE SURG 15 STRL LF DISP TIS (BLADE) ×1 IMPLANT
BLADE SURG 15 STRL SS (BLADE) ×2
CANISTER SUCTION 1200CC (MISCELLANEOUS) ×2 IMPLANT
CHLORAPREP W/TINT 26ML (MISCELLANEOUS) ×2 IMPLANT
CLIP TI WIDE RED SMALL 6 (CLIP) ×1 IMPLANT
CLOTH BEACON ORANGE TIMEOUT ST (SAFETY) ×2 IMPLANT
COVER MAYO STAND STRL (DRAPES) ×2 IMPLANT
COVER TABLE BACK 60X90 (DRAPES) ×2 IMPLANT
DECANTER SPIKE VIAL GLASS SM (MISCELLANEOUS) ×2 IMPLANT
DERMABOND ADVANCED (GAUZE/BANDAGES/DRESSINGS) ×1
DERMABOND ADVANCED .7 DNX12 (GAUZE/BANDAGES/DRESSINGS) ×1 IMPLANT
DEVICE DUBIN W/COMP PLATE 8390 (MISCELLANEOUS) ×1 IMPLANT
DRAPE LAPAROSCOPIC ABDOMINAL (DRAPES) ×2 IMPLANT
DRAPE UTILITY XL STRL (DRAPES) ×2 IMPLANT
ELECT COATED BLADE 2.86 ST (ELECTRODE) ×2 IMPLANT
ELECT REM PT RETURN 9FT ADLT (ELECTROSURGICAL) ×2
ELECTRODE REM PT RTRN 9FT ADLT (ELECTROSURGICAL) ×1 IMPLANT
GLOVE BIO SURGEON STRL SZ7.5 (GLOVE) ×2 IMPLANT
GLOVE SURG SS PI 7.0 STRL IVOR (GLOVE) ×1 IMPLANT
GOWN PREVENTION PLUS XLARGE (GOWN DISPOSABLE) ×3 IMPLANT
KIT MARKER MARGIN INK (KITS) ×1 IMPLANT
NDL HYPO 25X1 1.5 SAFETY (NEEDLE) ×1 IMPLANT
NEEDLE HYPO 25X1 1.5 SAFETY (NEEDLE) ×2 IMPLANT
NS IRRIG 1000ML POUR BTL (IV SOLUTION) ×2 IMPLANT
PACK BASIN DAY SURGERY FS (CUSTOM PROCEDURE TRAY) ×2 IMPLANT
PENCIL BUTTON HOLSTER BLD 10FT (ELECTRODE) ×2 IMPLANT
SLEEVE SCD COMPRESS KNEE MED (MISCELLANEOUS) ×2 IMPLANT
SPONGE LAP 18X18 X RAY DECT (DISPOSABLE) ×2 IMPLANT
STAPLER VISISTAT 35W (STAPLE) IMPLANT
SUT MON AB 4-0 PC3 18 (SUTURE) ×2 IMPLANT
SUT SILK 2 0 SH (SUTURE) ×2 IMPLANT
SUT VIC AB 3-0 54X BRD REEL (SUTURE) IMPLANT
SUT VIC AB 3-0 BRD 54 (SUTURE)
SUT VICRYL 3-0 CR8 SH (SUTURE) ×2 IMPLANT
SYR CONTROL 10ML LL (SYRINGE) ×2 IMPLANT
TOWEL OR 17X24 6PK STRL BLUE (TOWEL DISPOSABLE) ×2 IMPLANT
TOWEL OR NON WOVEN STRL DISP B (DISPOSABLE) ×1 IMPLANT
TUBE CONNECTING 20X1/4 (TUBING) ×2 IMPLANT
YANKAUER SUCT BULB TIP NO VENT (SUCTIONS) ×2 IMPLANT

## 2012-07-30 NOTE — Anesthesia Postprocedure Evaluation (Signed)
  Anesthesia Post-op Note  Patient: [name]  Procedure(s) Performed: Procedure(s): RIGHT BREAST WIRE LOCALIZATION LUMPECTOMY  (Right)  Patient Location: PACU  Anesthesia Type:General  Level of Consciousness: awake and alert   Airway and Oxygen Therapy: Patient Spontanous Breathing  Post-op Pain: mild  Post-op Assessment: Post-op Vital signs reviewed  Post-op Vital Signs: stable  Complications: No apparent anesthesia complications

## 2012-07-30 NOTE — Anesthesia Procedure Notes (Signed)
Procedure Name: LMA Insertion Date/Time: 07/30/2012 9:41 AM Performed by: Gar Gibbon Pre-anesthesia Checklist: Patient identified, Emergency Drugs available, Suction available and Patient being monitored Patient Re-evaluated:Patient Re-evaluated prior to inductionOxygen Delivery Method: Circle System Utilized Preoxygenation: Pre-oxygenation with 100% oxygen Intubation Type: IV induction Ventilation: Mask ventilation without difficulty LMA: LMA inserted LMA Size: 4.0 Number of attempts: 1 Airway Equipment and Method: bite block Placement Confirmation: positive ETCO2 Tube secured with: Tape Dental Injury: Teeth and Oropharynx as per pre-operative assessment

## 2012-07-30 NOTE — Anesthesia Preprocedure Evaluation (Signed)
Anesthesia Evaluation  Patient identified by MRN, date of birth, ID band Patient awake    Reviewed: Allergy & Precautions, H&P , NPO status , Patient's Chart, lab work & pertinent test results  History of Anesthesia Complications Negative for: history of anesthetic complications  Airway Mallampati: I  Neck ROM: Full    Dental  (+) Teeth Intact   Pulmonary  breath sounds clear to auscultation        Cardiovascular Rhythm:Regular Rate:Normal     Neuro/Psych    GI/Hepatic negative GI ROS, Neg liver ROS,   Endo/Other  negative endocrine ROS  Renal/GU negative Renal ROS     Musculoskeletal   Abdominal   Peds  Hematology   Anesthesia Other Findings   Reproductive/Obstetrics                           Anesthesia Physical Anesthesia Plan  ASA: II  Anesthesia Plan: General   Post-op Pain Management:    Induction: Intravenous  Airway Management Planned: LMA  Additional Equipment:   Intra-op Plan:   Post-operative Plan: Extubation in OR  Informed Consent: I have reviewed the patients History and Physical, chart, labs and discussed the procedure including the risks, benefits and alternatives for the proposed anesthesia with the patient or authorized representative who has indicated his/her understanding and acceptance.     Plan Discussed with: CRNA and Surgeon  Anesthesia Plan Comments:         Anesthesia Quick Evaluation

## 2012-07-30 NOTE — Interval H&P Note (Signed)
History and Physical Interval Note:  07/30/2012 9:22 AM  [name]  has presented today for surgery, with the diagnosis of right breast papilloma  The various methods of treatment have been discussed with the patient and family. After consideration of risks, benefits and other options for treatment, the patient has consented to  Procedure(s): RIGHT BREAST WIRE LOCALIZATION LUMPECTOMY  (Right) as a surgical intervention .  The patient's history has been reviewed, patient examined, no change in status, stable for surgery.  I have reviewed the patient's chart and labs.  Questions were answered to the patient's satisfaction.     TOTH III,Brittiany Wiehe S

## 2012-07-30 NOTE — Transfer of Care (Signed)
Immediate Anesthesia Transfer of Care Note  Patient: [name]  Procedure(s) Performed: Procedure(s): RIGHT BREAST WIRE LOCALIZATION LUMPECTOMY  (Right)  Patient Location: PACU  Anesthesia Type:General  Level of Consciousness: awake, oriented and patient cooperative  Airway & Oxygen Therapy: Patient Spontanous Breathing and Patient connected to face mask oxygen  Post-op Assessment: Report given to PACU RN and Post -op Vital signs reviewed and stable  Post vital signs: Reviewed and stable  Complications: No apparent anesthesia complications

## 2012-07-30 NOTE — Op Note (Signed)
07/30/2012  10:17 AM  PATIENT:  [name]  47 y.o. female  PRE-OPERATIVE DIAGNOSIS:  right breast papilloma  POST-OPERATIVE DIAGNOSIS:  right breast papilloma  PROCEDURE:  Procedure(s): RIGHT BREAST WIRE LOCALIZATION LUMPECTOMY  (Right)  SURGEON:  Surgeon(s) and Role:    * Robyne Askew, MD - Primary  PHYSICIAN ASSISTANT:   ASSISTANTS: none   ANESTHESIA:   general  EBL:     BLOOD ADMINISTERED:none  DRAINS: none   LOCAL MEDICATIONS USED:  MARCAINE     SPECIMEN:  Source of Specimen:  right breast tissue  DISPOSITION OF SPECIMEN:  PATHOLOGY  COUNTS:  YES  TOURNIQUET:  * No tourniquets in log *  DICTATION: .Dragon Dictation After informed consent was obtained the patient was brought to the operating room and placed in the supine position on the operating table. After adequate induction of general anesthesia the patient's right breast was prepped with ChloraPrep, allowed to dry, and draped in the usual sterile manner. Earlier in the day the patient underwent wire localization procedure and the wire was entering the right breast laterally and headed deep and slightly medially. A transversely oriented incision was made in the 9:00 position of the right breast with a 15 blade knife. This incision was carried through the skin and subcutaneous tissue sharply with the electrocautery. Once into the breast tissue the path of the wire could be palpated. A circular portion of breast tissue was excised sharply with the electrocautery around the path of the wire. Once the specimen was removed it was oriented with a short stitch on the superior surface and a long stitch on the lateral surface. A specimen radiograph was obtained that showed the clip and wire to be in the center of the specimen. The specimens and sent to pathology for further evaluation. The wound was infiltrated with quarter percent Marcaine and irrigated with saline. Hemostasis was achieved using electrocautery. The deep  layer the wound was closed with interrupted 3-0 Vicryl stitches. The skin was then closed with interrupted 4-0 Vicryl subcuticular stitches. Dermabond dressings were applied. The patient tolerated the procedure well. At the end of the case all needle sponge and instrument counts were correct. The patient was then awakened and taken to recovery in stable condition.  PLAN OF CARE: Discharge to home after PACU  PATIENT DISPOSITION:  PACU - hemodynamically stable.   Delay start of Pharmacological VTE agent (>24hrs) due to surgical blood loss or risk of bleeding: not applicable

## 2012-07-30 NOTE — H&P (View-Only) (Signed)
Subjective:     Patient ID: [name], female   DOB: 06/09/1965, 47 y.o.   MRN: [id number]  HPI We're asked to see the patient in consultation by Dr. Jackson to evaluate her for a right breast papilloma. The patient is a 47-year-old white female who recently went for a routine screening mammogram. At that time she was not having any breast pain or discharge. An abnormality was noted on her mammogram in the 9 o'clock position of the right breast. This was biopsied and came back as a papilloma. She has no personal history of breast problems. She does have some family history of breast cancer in a maternal aunt and 2 maternal cousins  Review of Systems  Constitutional: Negative.   HENT: Negative.   Eyes: Negative.   Respiratory: Negative.   Cardiovascular: Negative.   Gastrointestinal: Negative.   Endocrine: Negative.   Genitourinary: Negative.   Musculoskeletal: Negative.   Skin: Negative.   Allergic/Immunologic: Negative.   Neurological: Negative.   Hematological: Negative.   Psychiatric/Behavioral: Negative.        Objective:   Physical Exam  Constitutional: She is oriented to person, place, and time. She appears well-developed and well-nourished.  HENT:  Head: Normocephalic and atraumatic.  Eyes: Conjunctivae and EOM are normal. Pupils are equal, round, and reactive to light.  Neck: Normal range of motion. Neck supple.  Cardiovascular: Normal rate, regular rhythm and normal heart sounds.   Pulmonary/Chest: Effort normal and breath sounds normal.  There is no palpable mass in either breast. There is no palpable axillary supraclavicular or cervical lymphadenopathy.  Abdominal: Soft. Bowel sounds are normal. She exhibits no mass. There is no tenderness.  Musculoskeletal: Normal range of motion.  Lymphadenopathy:    She has no cervical adenopathy.  Neurological: She is alert and oriented to person, place, and time.  Skin: Skin is warm and dry.  Psychiatric: She has a normal  mood and affect. Her behavior is normal.       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 Because of this finding increases her risk of breast cancer I think he would be reasonable to do a lumpectomy to this area to make sure it is removed and make sure there is nothing else going on and that duct system in that location. I have discussed with her in detail the risks and benefits of the operation to do this as well as some of the technical aspects and she understands and wishes to proceed     Plan:     Plan for right breast wire localized lumpectomy       

## 2012-07-31 ENCOUNTER — Encounter (HOSPITAL_BASED_OUTPATIENT_CLINIC_OR_DEPARTMENT_OTHER): Payer: Self-pay | Admitting: General Surgery

## 2012-08-04 ENCOUNTER — Telehealth (INDEPENDENT_AMBULATORY_CARE_PROVIDER_SITE_OTHER): Payer: Self-pay

## 2012-08-04 NOTE — Telephone Encounter (Signed)
Pt called stating she has small area just under the surgical breast that has red rash that itches. No fever. No drainage. Surgical site looks fine per pt. Pt advised this may be from prep solution. Pt to shower,pat dry and use thin layer of cortisone cream to area. Pt to call if area spreads,becomes feverish or does not improve. Pt states she understands.

## 2012-08-05 ENCOUNTER — Telehealth (INDEPENDENT_AMBULATORY_CARE_PROVIDER_SITE_OTHER): Payer: Self-pay

## 2012-08-05 NOTE — Telephone Encounter (Signed)
Please call pt with her path results. Pls call home or cell.

## 2012-08-05 NOTE — Telephone Encounter (Signed)
Called [name] and gave her benign path results.

## 2012-08-20 ENCOUNTER — Ambulatory Visit (INDEPENDENT_AMBULATORY_CARE_PROVIDER_SITE_OTHER): Payer: Commercial Indemnity | Admitting: General Surgery

## 2012-08-20 ENCOUNTER — Encounter (INDEPENDENT_AMBULATORY_CARE_PROVIDER_SITE_OTHER): Payer: Self-pay | Admitting: General Surgery

## 2012-08-20 VITALS — BP 130/70 | HR 68 | Temp 97.2°F | Resp 14 | Ht 66.0 in | Wt 225.4 lb

## 2012-08-20 DIAGNOSIS — D249 Benign neoplasm of unspecified breast: Secondary | ICD-10-CM

## 2012-08-20 DIAGNOSIS — D241 Benign neoplasm of right breast: Secondary | ICD-10-CM

## 2012-08-20 NOTE — Patient Instructions (Signed)
May return to all normal activities 

## 2012-08-20 NOTE — Progress Notes (Signed)
Subjective:     Patient ID: [name], female   DOB: 04-Oct-1965, 47 y.o.   MRN: [id number]  HPI The patient is a 47 year old white female who is 3 weeks status post right breast lumpectomy for a papilloma. She notes some minor breast soreness but this seems to be improving. She otherwise feels well.  Review of Systems     Objective:   Physical Exam On exam her right breast incision is healing nicely with no sign of infection or significant seroma.    Assessment:     The patient is 3 weeks status post right lumpectomy for papilloma     Plan:     At this point she can return to all her normal activities without restriction. We will plan to see her back on a when necessary basis. She will continue to do regular self exams. She will still need yearly mammograms.

## 2013-01-08 HISTORY — PX: ANTERIOR FUSION CERVICAL SPINE: SUR626

## 2013-07-09 ENCOUNTER — Other Ambulatory Visit: Payer: Self-pay | Admitting: Internal Medicine

## 2013-07-09 DIAGNOSIS — R300 Dysuria: Secondary | ICD-10-CM

## 2013-07-09 DIAGNOSIS — R109 Unspecified abdominal pain: Secondary | ICD-10-CM

## 2013-07-13 ENCOUNTER — Ambulatory Visit
Admission: RE | Admit: 2013-07-13 | Discharge: 2013-07-13 | Disposition: A | Discharge status: Home / Self Care | Payer: Managed Care, Other (non HMO) | Source: Ambulatory Visit | Attending: Internal Medicine | Admitting: Internal Medicine

## 2013-07-13 DIAGNOSIS — R300 Dysuria: Secondary | ICD-10-CM

## 2013-07-13 DIAGNOSIS — R109 Unspecified abdominal pain: Secondary | ICD-10-CM

## 2013-07-13 MED ORDER — IOHEXOL 300 MG/ML  SOLN
125.0000 mL | Freq: Once | INTRAMUSCULAR | Status: AC | PRN
  Administered 2013-07-13: 125 mL via INTRAVENOUS

## 2013-08-06 ENCOUNTER — Other Ambulatory Visit (HOSPITAL_COMMUNITY): Payer: Self-pay | Admitting: Respiratory Therapy

## 2013-08-06 DIAGNOSIS — R05 Cough: Secondary | ICD-10-CM

## 2013-08-06 DIAGNOSIS — R059 Cough, unspecified: Secondary | ICD-10-CM

## 2013-08-17 ENCOUNTER — Encounter (HOSPITAL_COMMUNITY): Payer: Managed Care, Other (non HMO)

## 2013-09-15 IMAGING — MG MM BREAST WIRE LOCALIZATION*R*
1 series · 1 of 1 positions shown · non-contrast
Comparison: Prior examinations including 06/24/2012

CLINICAL DATA: Patient with right breast papilloma which is being
marked for surgical excision.

NEEDLE LOCALIZATION WITH MAMMOGRAPHIC GUIDANCE AND SPECIMEN
RADIOGRAPH

[R CC]
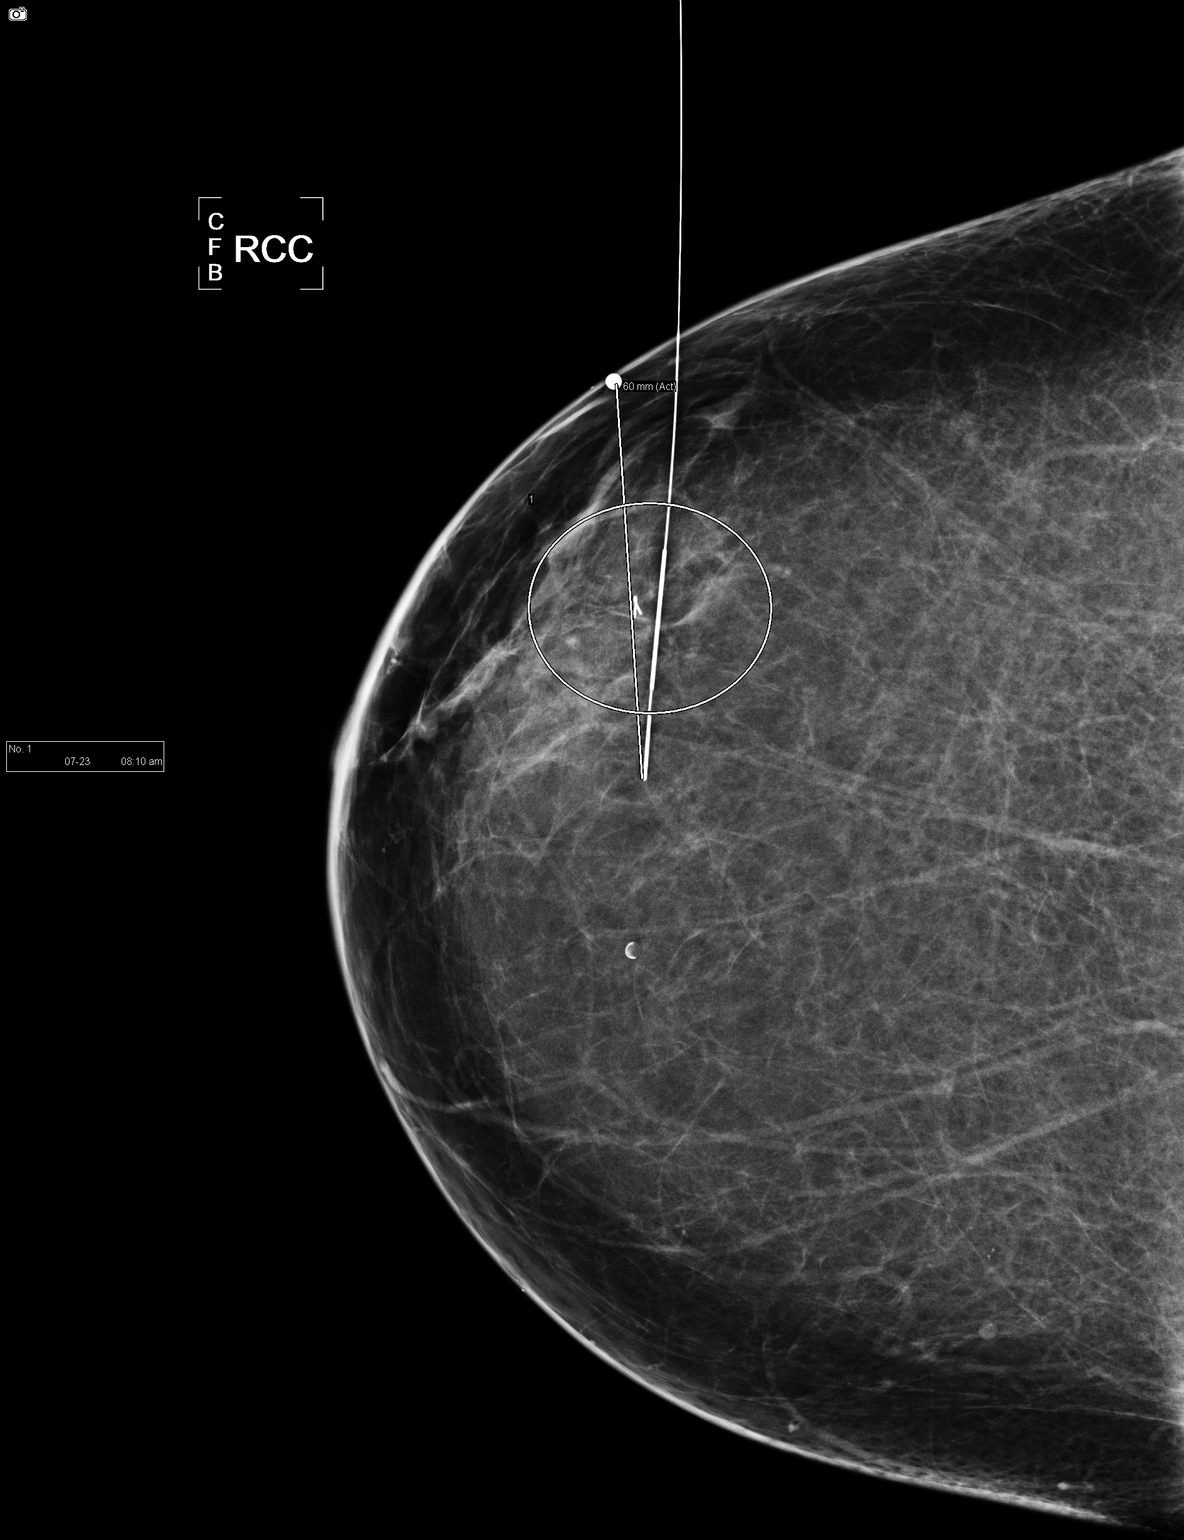

[1 of 1 positions shown; findings below may reference images not displayed]

Patient presents for needle localization prior to right breast
surgical excision. I met with the patient and we discussed the
procedure of needle localization including benefits and
alternatives. We discussed the high likelihood of a successful
procedure. We discussed the risks of the procedure, including
infection, bleeding, tissue injury, and further surgery. Informed,
written consent was given. The usual time-out protocol was
performed immediately prior to the procedure.

Using mammographic guidance, sterile technique, 2% lidocaine and a
7 cm modified Kopans needle the biopsy marking clip within the 9
o'clock position right breast was localized using a lateral
approach.  The films are marked for Dr. Shalley. Specimen radiograph
was performed at day surgery and confirms biopsy marking clip and
Kopans wire present in the tissue sample.  The specimen is marked
for pathology.
IMPRESSION: Needle localization right breast biopsy marking clip.  No apparent
complications.

## 2014-07-20 ENCOUNTER — Other Ambulatory Visit: Payer: Self-pay | Admitting: Obstetrics & Gynecology

## 2014-07-21 LAB — CYTOLOGY - PAP

## 2014-07-23 ENCOUNTER — Other Ambulatory Visit: Payer: Self-pay | Admitting: Obstetrics & Gynecology

## 2014-07-23 DIAGNOSIS — R928 Other abnormal and inconclusive findings on diagnostic imaging of breast: Secondary | ICD-10-CM

## 2014-07-30 ENCOUNTER — Ambulatory Visit
Admission: RE | Admit: 2014-07-30 | Discharge: 2014-07-30 | Disposition: A | Discharge status: Home / Self Care | Payer: Managed Care, Other (non HMO) | Source: Ambulatory Visit | Attending: Obstetrics & Gynecology | Admitting: Obstetrics & Gynecology

## 2014-07-30 DIAGNOSIS — R928 Other abnormal and inconclusive findings on diagnostic imaging of breast: Secondary | ICD-10-CM

## 2015-10-20 ENCOUNTER — Other Ambulatory Visit: Payer: Self-pay | Admitting: Registered Nurse

## 2015-10-20 DIAGNOSIS — R0789 Other chest pain: Secondary | ICD-10-CM

## 2015-10-20 DIAGNOSIS — I10 Essential (primary) hypertension: Secondary | ICD-10-CM

## 2015-11-02 ENCOUNTER — Ambulatory Visit (HOSPITAL_COMMUNITY): Payer: Managed Care, Other (non HMO) | Attending: Cardiovascular Disease

## 2015-11-02 DIAGNOSIS — R0789 Other chest pain: Secondary | ICD-10-CM | POA: Diagnosis not present

## 2015-11-02 DIAGNOSIS — I10 Essential (primary) hypertension: Secondary | ICD-10-CM

## 2015-11-02 DIAGNOSIS — R0609 Other forms of dyspnea: Secondary | ICD-10-CM | POA: Diagnosis not present

## 2015-11-02 DIAGNOSIS — R5383 Other fatigue: Secondary | ICD-10-CM | POA: Insufficient documentation

## 2015-11-02 MED ORDER — TECHNETIUM TC 99M TETROFOSMIN IV KIT
32.9000 | PACK | Freq: Once | INTRAVENOUS | Status: AC | PRN
  Administered 2015-11-02: 32.9 via INTRAVENOUS
  Filled 2015-11-02: qty 33

## 2015-11-03 ENCOUNTER — Ambulatory Visit (HOSPITAL_COMMUNITY): Payer: Managed Care, Other (non HMO) | Attending: Cardiology

## 2015-11-03 LAB — MYOCARDIAL PERFUSION IMAGING
CHL CUP MPHR: 169 {beats}/min
CHL CUP NUCLEAR SSS: 1
CHL CUP RESTING HR STRESS: 88 {beats}/min
CSEPPHR: 169 {beats}/min
Estimated workload: 7 METS
Exercise duration (min): 6 min
Exercise duration (sec): 0 s
LV dias vol: 70 mL (ref 46–106)
LV sys vol: 21 mL
Percent HR: 100 %
RATE: 0.31
SDS: 1
SRS: 0
TID: 0.7

## 2015-11-03 MED ORDER — TECHNETIUM TC 99M TETROFOSMIN IV KIT
31.4000 | PACK | Freq: Once | INTRAVENOUS | Status: AC | PRN
  Administered 2015-11-03: 31.4 via INTRAVENOUS
  Filled 2015-11-03: qty 32

## 2016-11-09 ENCOUNTER — Other Ambulatory Visit: Payer: Self-pay | Admitting: Internal Medicine

## 2016-11-20 ENCOUNTER — Other Ambulatory Visit: Payer: Self-pay | Admitting: Internal Medicine

## 2016-11-23 ENCOUNTER — Other Ambulatory Visit: Payer: Self-pay | Admitting: Internal Medicine

## 2019-02-20 ENCOUNTER — Telehealth (HOSPITAL_COMMUNITY): Payer: Self-pay

## 2019-02-20 ENCOUNTER — Other Ambulatory Visit: Payer: Self-pay

## 2019-02-20 DIAGNOSIS — M79604 Pain in right leg: Secondary | ICD-10-CM

## 2019-02-20 DIAGNOSIS — M79605 Pain in left leg: Secondary | ICD-10-CM

## 2019-02-20 NOTE — Telephone Encounter (Signed)

## 2019-02-23 ENCOUNTER — Ambulatory Visit (INDEPENDENT_AMBULATORY_CARE_PROVIDER_SITE_OTHER): Payer: Managed Care, Other (non HMO) | Admitting: Physician Assistant

## 2019-02-23 ENCOUNTER — Ambulatory Visit (HOSPITAL_COMMUNITY)
Admission: RE | Admit: 2019-02-23 | Discharge: 2019-02-23 | Disposition: A | Discharge status: Home / Self Care | Payer: Managed Care, Other (non HMO) | Source: Ambulatory Visit | Attending: Surgery | Admitting: Surgery

## 2019-02-23 ENCOUNTER — Other Ambulatory Visit: Payer: Self-pay

## 2019-02-23 DIAGNOSIS — M79604 Pain in right leg: Secondary | ICD-10-CM | POA: Insufficient documentation

## 2019-02-23 DIAGNOSIS — M79605 Pain in left leg: Secondary | ICD-10-CM

## 2019-02-23 DIAGNOSIS — I872 Venous insufficiency (chronic) (peripheral): Secondary | ICD-10-CM | POA: Diagnosis not present

## 2019-02-23 DIAGNOSIS — I8393 Asymptomatic varicose veins of bilateral lower extremities: Secondary | ICD-10-CM

## 2019-02-23 NOTE — Progress Notes (Addendum)
Requested by:  Haywood Pao, [place],  [place] [address]  Reason for consultation: varicose veins    History of Present Illness   [name] is a 54 y.o. (11-Jun-1965) female who presents with chief complaint of severe aching pain of bilateral lower extremities from buttock to distal thigh.  This pain has been present for couple months however has intensified.  Pain is worse when lying flat or sitting however resolves with standing.  She has a strong maternal family history of varicose veins however is not bothered by pain related to varicose veins, edema, or appearance.  She also denies any history of DVT, saphenous ulcerations, or trauma to bilateral lower extremities.  Her PCP recently added HCTZ and patient states this has improved the edema of her bilateral lower extremities.  She would not like to pursue treatment of varicose veins if this will not help the current pain she is in of bilateral lower extremities. She wears compression stockings occasionally to work without difficulty.  She denies any claudication type pain or nonhealing wounds of her feet.  She had a cervical spine fusion about 5 years ago.   Patient states she has not been worked up for lumbar spine stenosis.  She denies tobacco use.   Past Medical History:  Diagnosis Date  . Allergic rhinitis   . Hypertension   . Seasonal allergies     Past Surgical History:  Procedure Laterality Date  . BREAST LUMPECTOMY WITH NEEDLE LOCALIZATION Right 07/30/2012   Procedure: RIGHT BREAST WIRE LOCALIZATION LUMPECTOMY ;  Surgeon: Merrie Roof, MD;  Location: [facility];  Service: General;  Laterality: Right;  . CHOLECYSTECTOMY  07-09-2007   lap choli  . DIAGNOSTIC LAPAROSCOPY  2010   ectopic preg rt    Social History   Socioeconomic History  . Marital status: Married    Spouse name: Not on file  . Number of children: Not on file  . Years of education: Not on file  . Highest  education level: Not on file  Occupational History  . Not on file  Tobacco Use  . Smoking status: Never Smoker  . Smokeless tobacco: Never Used  Substance and Sexual Activity  . Alcohol use: No  . Drug use: No  . Sexual activity: Not on file  Other Topics Concern  . Not on file  Social History Narrative  . Not on file   Social Determinants of Health   Financial Resource Strain:   . Difficulty of Paying Living Expenses: Not on file  Food Insecurity:   . Worried About Charity fundraiser in the Last Year: Not on file  . Ran Out of Food in the Last Year: Not on file  Transportation Needs:   . Lack of Transportation (Medical): Not on file  . Lack of Transportation (Non-Medical): Not on file  Physical Activity:   . Days of Exercise per Week: Not on file  . Minutes of Exercise per Session: Not on file  Stress:   . Feeling of Stress : Not on file  Social Connections:   . Frequency of Communication with Friends and Family: Not on file  . Frequency of Social Gatherings with Friends and Family: Not on file  . Attends Religious Services: Not on file  . Active Member of Clubs or Organizations: Not on file  . Attends Archivist Meetings: Not on file  . Marital Status: Not on file  Intimate Partner Violence:   .  Fear of Current or Ex-Partner: Not on file  . Emotionally Abused: Not on file  . Physically Abused: Not on file  . Sexually Abused: Not on file    Family History  Problem Relation Age of Onset  . Hypertension Mother   . Cancer Mother        skin  . Hypertension Father   . Cancer Father        skin    Current Outpatient Medications  Medication Sig Dispense Refill  . acetaminophen (TYLENOL) 500 MG chewable tablet Chew 500 mg by mouth every 6 (six) hours as needed.    . ALPRAZolam (XANAX) 0.5 MG tablet     . AZOR 5-40 MG per tablet     . chlorpheniramine (CHLOR-TRIMETON) 4 MG tablet Take 8 mg by mouth 2 (two) times daily.    . hydrochlorothiazide  (HYDRODIURIL) 25 MG tablet Take 25 mg by mouth daily.    Marland Kitchen ibuprofen (ADVIL,MOTRIN) 200 MG tablet Take 200 mg by mouth every 6 (six) hours as needed.    . benzonatate (TESSALON) 200 MG capsule Take 1 capsule by mouth Three times daily as needed.     No current facility-administered medications for this visit.    Allergies  Allergen Reactions  . Hydrocodone Anaphylaxis    Couldn't move arms and couldn't walk  . Avelox [Moxifloxacin Hcl In Nacl]     Nervous   . Codeine     Nervous/anxious     REVIEW OF SYSTEMS (negative unless checked):   Cardiac:  []  Chest pain or chest pressure? []  Shortness of breath upon activity? []  Shortness of breath when lying flat? []  Irregular heart rhythm?  Vascular:  []  Pain in calf, thigh, or hip brought on by walking? []  Pain in feet at night that wakes you up from your sleep? []  Blood clot in your veins? [x]  Leg swelling?  Pulmonary:  []  Oxygen at home? []  Productive cough? []  Wheezing?  Neurologic:  []  Sudden weakness in arms or legs? []  Sudden numbness in arms or legs? []  Sudden onset of difficult speaking or slurred speech? []  Temporary loss of vision in one eye? []  Problems with dizziness?  Gastrointestinal:  []  Blood in stool? []  Vomited blood?  Genitourinary:  []  Burning when urinating? []  Blood in urine?  Psychiatric:  []  Major depression  Hematologic:  []  Bleeding problems? []  Problems with blood clotting?  Dermatologic:  []  Rashes or ulcers?  Constitutional:  []  Fever or chills?  Ear/Nose/Throat:  []  Change in hearing? []  Nose bleeds? []  Sore throat?  Musculoskeletal:  [x]  Back pain? [x]  Joint pain? []  Muscle pain?   Physical Examination     Vitals:   02/23/19 1133  BP: 115/77  Pulse: 81  Resp: 16  Temp: 97.7 F (36.5 C)  TempSrc: Temporal  SpO2: 100%  Weight: 235 lb (106.6 kg)  Height: 5\' 7"  (1.702 m)   Body mass index is 36.81 kg/m.  General Alert, O x 3, WD, NAD  Head [place]/AT,      Neck Supple, mid-line trachea,    Pulmonary Sym exp, good B air movt,  Cardiac RRR, Nl S1, S2,  Vascular Vessel Right Left  Radial Palpable Palpable  DP Palpable Palpable    Gastro- intestinal soft, non-distended, non-tender to palpation,   Musculo- skeletal  scattered spider vein clusters of bilateral lower extremities mainly right lateral leg and left medial leg; ropey varicosity from mid left thigh to below the left knee medially; no pain with manipulation of  varicosity  Neurologic Cranial nerves 2-12 intact ,  Psychiatric Judgement intact, Mood & affect appropriate for pt's clinical situation  Dermatologic See M/S exam for extremity exam, No rashes otherwise noted  Lymphatic  Palpable lymph nodes: None    Non-invasive Vascular Imaging   BLE Venous Insufficiency Duplex:   RLE:   no DVT and SVT,    GSV reflux at [facility] and proximal calf,   no SSV reflux,  No deep venous reflux  LLE:  no DVT and SVT,    GSV reflux at proximal and mid thigh and mid calf,   no SSV reflux,  no deep venous reflux   Medical Decision Making   Chava Axt is a 54 y.o. female who presents with: BLE chronic venous insufficiency,  and asymptomatic varicose veins   Some evidence of superficial reflux in right lower extremity however not a candidate for ablation therapy  Some evidence of reflux in left lower extremity however GSV appears to be small in diameter throughout left lower extremity and there is no reflux at saphenofemoral junction; likely not a candidate for ablation therapy currently  Patient however is not bothered by spider veins, varicose veins, or edema at this time and does not want to pursue treatment of veins given her current symptoms of bilateral lower extremity pain  Pain of bilateral lower extremities is symmetrical radiating from buttock to distal thigh made worse with sitting and resolves with standing.  This is atypical for discomfort related to venous reflux  and/or varicose veins.  BLE also well perfused with palpable pedal pulses which rules out arterial insufficiency  She will be referred back to [facility] for evaluation of lumbar spine  If in the future she would like to be reevaluated for her varicose veins and venous reflux, she can notify our office to schedule an appointment   Dagoberto Ligas PA-C [facility] Office: [phone]  02/23/2019, 12:13 PM  Clinic MD: Dr. Oneida Alar on call

## 2020-01-31 ENCOUNTER — Other Ambulatory Visit: Payer: Self-pay

## 2020-01-31 ENCOUNTER — Encounter (HOSPITAL_COMMUNITY): Payer: Self-pay

## 2020-01-31 ENCOUNTER — Ambulatory Visit (HOSPITAL_COMMUNITY)
Admission: EM | Admit: 2020-01-31 | Discharge: 2020-01-31 | Disposition: A | Discharge status: Home / Self Care | Payer: Managed Care, Other (non HMO) | Attending: Emergency Medicine | Admitting: Emergency Medicine

## 2020-01-31 DIAGNOSIS — M436 Torticollis: Secondary | ICD-10-CM | POA: Diagnosis not present

## 2020-01-31 MED ORDER — KETOROLAC TROMETHAMINE 30 MG/ML IJ SOLN
INTRAMUSCULAR | Status: AC
  Filled 2020-01-31: qty 1

## 2020-01-31 MED ORDER — KETOROLAC TROMETHAMINE 30 MG/ML IJ SOLN: 30.0000 mg | Freq: Once | INTRAMUSCULAR | Status: DC

## 2020-01-31 MED ORDER — DIAZEPAM 5 MG/ML IJ SOLN
10.0000 mg | Freq: Once | INTRAMUSCULAR | Status: AC
  Administered 2020-01-31: 10 mg via INTRAMUSCULAR

## 2020-01-31 MED ORDER — DIAZEPAM 5 MG/ML IJ SOLN
INTRAMUSCULAR | Status: AC
Start: 2020-01-31 — End: ?
  Filled 2020-01-31: qty 2

## 2020-01-31 MED ORDER — KETOROLAC TROMETHAMINE 30 MG/ML IJ SOLN
30.0000 mg | Freq: Once | INTRAMUSCULAR | Status: AC
  Administered 2020-01-31: 30 mg via INTRAMUSCULAR

## 2020-01-31 MED ORDER — METHOCARBAMOL 500 MG PO TABS: 500.0000 mg | ORAL_TABLET | Freq: Two times a day (BID) | ORAL | 0 refills | Status: DC

## 2020-01-31 NOTE — ED Triage Notes (Signed)
Pt presents with stiff neck X 3 hours today.

## 2020-01-31 NOTE — Discharge Instructions (Signed)
Take Robaxin up to three times daily for the next five days.  

## 2020-01-31 NOTE — ED Provider Notes (Signed)
[facility]  ____________________________________________  Time seen: Approximately 6:39 PM  I have reviewed the triage vital signs and the nursing notes.   HISTORY  Chief Complaint Torticollis   Historian Patient     HPI [name] is a 55 y.o. female presents to the urgent care with neck stiffness that started approximately 3 hours ago.  Patient reports that she was washing her hair over the sink and felt what she describes as a cramping sensation.  Patient states that cramping sensation improved but patient's range of motion has since become limited.  She denies headache, changes in vision or fever.  She states that she has experienced muscle spasms in her neck in the past.  No falls or mechanisms of trauma.  No other alleviating measures have been attempted.   Past Medical History:  Diagnosis Date  . Allergic rhinitis   . Hypertension   . Seasonal allergies      Immunizations up to date:  Yes.     Past Medical History:  Diagnosis Date  . Allergic rhinitis   . Hypertension   . Seasonal allergies     Patient Active Problem List   Diagnosis Date Noted  . Chronic venous insufficiency 02/23/2019  . Varicose veins of both lower extremities 02/23/2019  . Bilateral leg pain 02/23/2019  . 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 Cough 02/28/2011    Past Surgical History:  Procedure Laterality Date  . BREAST LUMPECTOMY WITH NEEDLE LOCALIZATION Right 07/30/2012   Procedure: RIGHT BREAST WIRE LOCALIZATION LUMPECTOMY ;  Surgeon: Merrie Roof, MD;  Location: [facility];  Service: General;  Laterality: Right;  . CHOLECYSTECTOMY  07-09-2007   lap choli  . DIAGNOSTIC LAPAROSCOPY  2010   ectopic preg rt    Prior to Admission medications   Medication Sig Start Date End Date Taking? Authorizing Provider  amLODipine-olmesartan (AZOR) 5-40 MG tablet Take 1 tablet by mouth daily.   Yes [provider]  hydrochlorothiazide (HYDRODIURIL) 25  MG tablet Take 25 mg by mouth daily. 11/30/18  Yes [provider]  omeprazole (PRILOSEC) 20 MG capsule Take 20 mg by mouth daily.   Yes [provider]  phentermine 37.5 MG capsule Take 37.5 mg by mouth every morning.   Yes [provider]  acetaminophen (TYLENOL) 500 MG chewable tablet Chew 500 mg by mouth every 6 (six) hours as needed.    [provider]  ALPRAZolam Duanne Moron) 0.5 MG tablet  06/10/12   [provider]  AZOR 5-40 MG per tablet  07/02/12   [provider]  benzonatate (TESSALON) 200 MG capsule Take 1 capsule by mouth Three times daily as needed. 01/31/11   [provider]  chlorpheniramine (CHLOR-TRIMETON) 4 MG tablet Take 8 mg by mouth 2 (two) times daily.    [provider]  ibuprofen (ADVIL,MOTRIN) 200 MG tablet Take 200 mg by mouth every 6 (six) hours as needed.    [provider]    Allergies Hydrocodone, Avelox [moxifloxacin hcl in nacl], and Codeine  Family History  Problem Relation Age of Onset  . Hypertension Mother   . Cancer Mother        skin  . Hypertension Father   . Cancer Father        skin    Social History Social History   Tobacco Use  . Smoking status: Never Smoker  . Smokeless tobacco: Never Used  Substance Use Topics  . Alcohol use: No  . Drug use:  No     Review of Systems  Constitutional: No fever/chills Eyes:  No discharge ENT: No upper respiratory complaints. Respiratory: no cough. No SOB/ use of accessory muscles to breath Gastrointestinal:   No nausea, no vomiting.  No diarrhea.  No constipation. Musculoskeletal: Patient has neck stiffness.  Skin: Negative for rash, abrasions, lacerations, ecchymosis.   ____________________________________________   PHYSICAL EXAM:  VITAL SIGNS: ED Triage Vitals  Enc Vitals Group     BP 01/31/20 1757 119/66     Pulse Rate 01/31/20 1757 67     Resp 01/31/20 1757 17     Temp 01/31/20 1757 98.1 F (36.7 C)      Temp Source 01/31/20 1757 Oral     SpO2 01/31/20 1757 100 %     Weight --      Height --      Head Circumference --      Peak Flow --      Pain Score 01/31/20 1755 6     Pain Loc --      Pain Edu? --      Excl. in [place]? --      Constitutional: Alert and oriented. Well appearing and in no acute distress. Eyes: Conjunctivae are normal. PERRL. EOMI. Head: Atraumatic. ENT:      Ears: TMs are pearly.       Nose: No congestion/rhinnorhea.      Mouth/Throat: Mucous membranes are moist.  Neck: No stridor.  Patient performs limited range of motion at the neck.  Cardiovascular: Normal rate, regular rhythm. Normal S1 and S2.  Good peripheral circulation. Respiratory: Normal respiratory effort without tachypnea or retractions. Lungs CTAB. Good air entry to the bases with no decreased or absent breath sounds Gastrointestinal: Bowel sounds x 4 quadrants. Soft and nontender to palpation. No guarding or rigidity. No distention. Musculoskeletal: Full range of motion to all extremities. No obvious deformities noted Neurologic:  Normal for age. No gross focal neurologic deficits are appreciated.  Skin:  Skin is warm, dry and intact. No rash noted. Psychiatric: Mood and affect are normal for age. Speech and behavior are normal.   ____________________________________________   LABS (all labs ordered are listed, but only abnormal results are displayed)  Labs Reviewed - No data to display ____________________________________________  EKG   ____________________________________________  RADIOLOGY   No results found.  ____________________________________________    PROCEDURES  Procedure(s) performed:     Procedures     Medications  diazepam (VALIUM) injection 10 mg (has no administration in time range)  ketorolac (TORADOL) 30 MG/ML injection 30 mg (has no administration in time range)     ____________________________________________   INITIAL IMPRESSION / ASSESSMENT AND  PLAN / ED COURSE  Pertinent labs & imaging results that were available during my care of the patient were reviewed by me and considered in my medical decision making (see chart for details).      Assessment and Plan: Muscle spasms:  Torticollis 55 year old female presents to the urgent care with torticollis that started today.  Patient received injections of Valium and Toradol and had improvement in range of motion of the neck.  She was discharged with Robaxin.  A work note was provided.  All patient questions were answered.    ____________________________________________  FINAL CLINICAL IMPRESSION(S) / ED DIAGNOSES  Final diagnoses:  None      NEW MEDICATIONS STARTED DURING THIS VISIT:  ED Discharge Orders    None          This chart was  dictated using voice recognition software/Dragon. Despite best efforts to proofread, errors can occur which can change the meaning. Any change was purely unintentional.     Lannie Fields, PA-C 01/31/20 1904

## 2021-01-19 ENCOUNTER — Ambulatory Visit: Payer: Managed Care, Other (non HMO) | Admitting: Orthopedic Surgery

## 2021-01-19 ENCOUNTER — Other Ambulatory Visit: Payer: Self-pay

## 2021-01-19 ENCOUNTER — Ambulatory Visit (INDEPENDENT_AMBULATORY_CARE_PROVIDER_SITE_OTHER): Payer: Managed Care, Other (non HMO)

## 2021-01-19 DIAGNOSIS — M79671 Pain in right foot: Secondary | ICD-10-CM

## 2021-01-20 ENCOUNTER — Encounter: Payer: Self-pay | Admitting: Orthopedic Surgery

## 2021-01-20 NOTE — Progress Notes (Signed)
Office Visit Note   Patient: [name]           Date of Birth: [birth year]           MRN: [id number] Visit Date: 01/19/2021              Requested by: Haywood Pao, MD [address] [place],  [place] [address] PCP: Osborne Casco, Fransico Him, MD  Chief Complaint  Patient presents with   Right Foot - Pain      HPI: Patient is a 56 year old woman who is seen for initial evaluation for right foot pain for 5 weeks.  Patient states she has medial and lateral foot pain.  Patient states she has tried a prednisone taper without relief.  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  Assessment & Plan: Visit Diagnoses:  1. 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  We will place her in a cam walker and recommended Voltaren gel 3 times a day over the posterior tibial tendon.  Follow-Up Instructions: Return in about 4 weeks (around 02/16/2021).   Ortho Exam  Patient is alert, oriented, no adenopathy, well-dressed, normal affect, normal respiratory effort. Examination patient has pain to palpation over the sinus Tarsi as well as along the course of the posterior tibial tendon.  She has pes planus with a pronated valgus forefoot and a too many toes sign.  She has extreme pain with attempted single limb heel raise.  All of her symptoms are consistent with posterior tibial tendon insufficiency.  Imaging: No results found. No images are attached to the encounter.  Labs: Lab Results  Component Value Date   HGBA1C  02/15/2009    5.8 (NOTE) The ADA recommends the following therapeutic goal for glycemic control related to Hgb A1c measurement: Goal of therapy: <6.5 Hgb A1c  Reference: American Diabetes Association: Clinical Practice Recommendations 2010, Diabetes Care, 2010, 33: (Suppl  1).     Lab Results  Component Value Date   ALBUMIN 3.0 (L) 07/13/2007   ALBUMIN 3.2 (L) 07/12/2007   ALBUMIN 3.4 (L) 07/11/2007     No results found for: MG No results found for: VD25OH  No results found for: PREALBUMIN CBC EXTENDED Latest Ref Rng & Units 07/30/2012 02/14/2009 05/26/2008  WBC 4.0 - 10.5 K/uL - 6.9 11.1(H)  RBC 3.87 - 5.11 MIL/uL - 4.48 3.12(L)  HGB 12.0 - 15.0 g/dL 13.4 12.8 9.7(L)  HCT 36.0 - 46.0 % - 37.3 27.3(L)  PLT 150 - 400 K/uL - 278 210  NEUTROABS 1.7 - 7.7 K/uL - 3.9 -  LYMPHSABS 0.7 - 4.0 K/uL - 2.5 -     There is no height or weight on file to calculate BMI.  Orders:  Orders Placed This Encounter  Procedures   XR Foot 2 Views Right   No orders of the defined types were placed in this encounter.    Procedures: No procedures performed  Clinical Data: No additional findings.  ROS:  All other systems negative, except as noted in the HPI. Review of Systems  Objective: Vital Signs: There were no vitals taken for this visit.  Specialty Comments:  No specialty comments available.  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/07/2012   Cough 02/28/2011   Past  Medical History:  Diagnosis Date   Allergic rhinitis    Hypertension    Seasonal allergies     Family History  Problem Relation Age of Onset   Hypertension Mother    Cancer Mother        skin   Hypertension Father    Cancer Father        skin    Past Surgical History:  Procedure Laterality Date   BREAST LUMPECTOMY WITH NEEDLE LOCALIZATION Right 07/30/2012   Procedure: RIGHT BREAST WIRE LOCALIZATION LUMPECTOMY ;  Surgeon: Merrie Roof, MD;  Location: [facility];  Service: General;  Laterality: Right;   CHOLECYSTECTOMY  07-09-2007   lap choli   DIAGNOSTIC LAPAROSCOPY  2010   ectopic preg rt   Social History   Occupational History   Not on file  Tobacco Use   Smoking status: Never   Smokeless tobacco: Never   Substance and Sexual Activity   Alcohol use: No   Drug use: No   Sexual activity: Not on file

## 2021-01-25 ENCOUNTER — Encounter: Payer: Self-pay | Admitting: Orthopedic Surgery

## 2021-01-26 ENCOUNTER — Telehealth: Payer: Self-pay | Admitting: Orthopedic Surgery

## 2021-01-26 NOTE — Telephone Encounter (Signed)
Responded to Message to pt in mychart. If she is having increased pain we should see her in the office. Asked if she would like to sch appt.

## 2021-01-26 NOTE — Telephone Encounter (Signed)
Pt called stating she would like a CB to discuss her pain levels and the possibility of being taken out of work.  [phone]

## 2021-01-27 ENCOUNTER — Ambulatory Visit: Payer: Managed Care, Other (non HMO) | Admitting: Family

## 2021-01-27 ENCOUNTER — Telehealth: Payer: Self-pay | Admitting: Orthopedic Surgery

## 2021-01-27 DIAGNOSIS — M76821 Posterior tibial tendinitis, right leg: Secondary | ICD-10-CM

## 2021-01-27 MED ORDER — PREDNISONE 10 MG PO TABS: 10.0000 mg | ORAL_TABLET | Freq: Every day | ORAL | 0 refills | Status: DC

## 2021-01-27 MED ORDER — NITROGLYCERIN 0.2 MG/HR TD PT24: 0.2000 mg | MEDICATED_PATCH | Freq: Every day | TRANSDERMAL | 0 refills | Status: DC

## 2021-01-27 NOTE — Progress Notes (Signed)
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  Has been having hours of stabbing pain after standing. she was seen in the office last week and did return to work for reduced work hours.  She has been in a cam walker using Voltaren gel   Unfortunately she continues to have excruciating pain she has not had any relief since last visit.  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  Assessment & Plan: Visit Diagnoses:  No diagnosis found.   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  She will continue with her cam walker did put in a pad over the medial arch to support her arch.  She will continue with the Voltaren gel have added a nitroglycerin patch as well as low-dose prednisone  Follow-Up Instructions: No follow-ups on file.   Ortho Exam  Patient is alert, oriented, no adenopathy, well-dressed, normal affect, normal respiratory effort. Examination patient has pain to palpation over the sinus Tarsi as well as along the course of the posterior tibial tendon.  She has pes planus with a pronated valgus forefoot and a too many toes sign.  She has extreme pain with attempted single limb heel raise.   Imaging: No results found. No images are attached to the encounter.  Labs: Lab Results  Component Value Date   HGBA1C  02/15/2009    5.8 (NOTE) The ADA recommends the following therapeutic goal for glycemic control related to Hgb A1c measurement: Goal of therapy: <6.5 Hgb A1c   Reference: American Diabetes Association: Clinical Practice Recommendations 2010, Diabetes Care, 2010, 33: (Suppl  1).     Lab Results  Component Value Date   ALBUMIN 3.0 (L) 07/13/2007   ALBUMIN 3.2 (L) 07/12/2007   ALBUMIN 3.4 (L) 07/11/2007    No results found for: MG No results found for: VD25OH  No results found for: PREALBUMIN CBC EXTENDED Latest Ref Rng & Units 07/30/2012 02/14/2009 05/26/2008  WBC 4.0 - 10.5 K/uL - 6.9 11.1(H)  RBC 3.87 - 5.11 MIL/uL - 4.48 3.12(L)  HGB 12.0 - 15.0 g/dL 13.4 12.8 9.7(L)  HCT 36.0 - 46.0 % - 37.3 27.3(L)  PLT 150 - 400 K/uL - 278 210  NEUTROABS 1.7 - 7.7 K/uL - 3.9 -  LYMPHSABS 0.7 - 4.0 K/uL - 2.5 -     There is no height or weight on file to calculate BMI.  Orders:  No orders of the defined types were placed in this encounter.  No orders of the defined types were placed in this encounter.    Procedures: No procedures performed  Clinical Data: No additional findings.  ROS:  All other systems negative, except as noted in the HPI. Review of Systems  Objective: Vital Signs: There were no vitals taken for this visit.  Specialty Comments:  No specialty comments available.  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/07/2012   Cough 02/28/2011   Past Medical History:  Diagnosis Date   Allergic rhinitis    Hypertension    Seasonal allergies     Family History  Problem Relation Age of Onset   Hypertension Mother    Cancer Mother        skin   Hypertension Father    Cancer Father        skin    Past Surgical History:  Procedure Laterality Date   BREAST LUMPECTOMY WITH NEEDLE LOCALIZATION Right 07/30/2012   Procedure: RIGHT BREAST WIRE LOCALIZATION LUMPECTOMY ;  Surgeon: Merrie Roof, MD;  Location: [facility];  Service:  General;  Laterality: Right;   CHOLECYSTECTOMY  07-09-2007   lap choli   DIAGNOSTIC LAPAROSCOPY  2010   ectopic preg rt   Social History   Occupational History   Not on file  Tobacco Use   Smoking status: Never   Smokeless tobacco: Never  Substance and Sexual Activity   Alcohol use: No   Drug use: No   Sexual activity: Not on file

## 2021-01-27 NOTE — Telephone Encounter (Signed)
Pt submitted 2 medical release forms. One for FMLA and one for Short term disability, Pt submitted [employer] forms. Pt is calling her short term company to have them fax short term forms, Pt paid two $25.00 checks totalled to $50.00. Accepted 01/27/2021. Please call pt when short term forms have been faxed to verify it was received. Pt phone number is [phone].

## 2021-01-30 ENCOUNTER — Telehealth: Payer: Self-pay | Admitting: Orthopedic Surgery

## 2021-01-30 NOTE — Telephone Encounter (Signed)
[place] [employer] forms received. To [employer].

## 2021-01-30 NOTE — Telephone Encounter (Signed)
IC, advised pt [place] [employer] forms received and to [employer]. She voiced understanding.

## 2021-02-16 ENCOUNTER — Ambulatory Visit: Payer: Managed Care, Other (non HMO) | Admitting: Orthopedic Surgery

## 2021-02-16 ENCOUNTER — Encounter: Payer: Self-pay | Admitting: Orthopedic Surgery

## 2021-02-16 ENCOUNTER — Other Ambulatory Visit: Payer: Self-pay

## 2021-02-16 DIAGNOSIS — M76821 Posterior tibial tendinitis, right leg: Secondary | ICD-10-CM | POA: Diagnosis not present

## 2021-02-16 NOTE — Progress Notes (Signed)
Office Visit Note   Patient: [name]           Date of Birth: Dec 11, 1965           MRN: [id number] Visit Date: 02/16/2021              Requested by: Haywood Pao, MD [address] [place],  [place] [address] PCP: Haywood Pao, MD  Chief Complaint  Patient presents with   Right Foot - Follow-up      HPI: Patient is a 56 year old woman who presents in follow-up for her posterior tibial tendon insufficiency on the right she has been using Voltaren gel which has worked a little she has taken prednisone.  She is currently in a fracture boot with arch support.  She states that her foot is better and showing improvement.  She did have a headache from the nitroglycerin patch.  Assessment & Plan: Visit Diagnoses:  1. Posterior tibial tendon dysfunction (PTTD) of right lower extremity     Plan: Continue with the fracture boot and Voltaren gel.  She was given a note to states that she will be out of work for an additional 4 weeks.  Reevaluate in 4 weeks for continued conservative therapy versus surgical intervention.  Follow-Up Instructions: Return in about 4 weeks (around 03/16/2021).   Ortho Exam  Patient is alert, oriented, no adenopathy, well-dressed, normal affect, normal respiratory effort. Examination patient has good pulses she is still tender over the posterior tibial tendon pain with resisted inversion.  She does not have pes planus and does not have pronation and valgus of her foot.  Imaging: No results found. No images are attached to the encounter.  Labs: Lab Results  Component Value Date   HGBA1C  02/15/2009    5.8 (NOTE) The ADA recommends the following therapeutic goal for glycemic control related to Hgb A1c measurement: Goal of therapy: <6.5 Hgb A1c  Reference: American Diabetes Association: Clinical Practice Recommendations 2010, Diabetes Care, 2010, 33: (Suppl  1).     Lab Results  Component Value Date   ALBUMIN 3.0 (L) 07/13/2007   ALBUMIN  3.2 (L) 07/12/2007   ALBUMIN 3.4 (L) 07/11/2007    No results found for: MG No results found for: VD25OH  No results found for: PREALBUMIN CBC EXTENDED Latest Ref Rng & Units 07/30/2012 02/14/2009 05/26/2008  WBC 4.0 - 10.5 K/uL - 6.9 11.1(H)  RBC 3.87 - 5.11 MIL/uL - 4.48 3.12(L)  HGB 12.0 - 15.0 g/dL 13.4 12.8 9.7(L)  HCT 36.0 - 46.0 % - 37.3 27.3(L)  PLT 150 - 400 K/uL - 278 210  NEUTROABS 1.7 - 7.7 K/uL - 3.9 -  LYMPHSABS 0.7 - 4.0 K/uL - 2.5 -     There is no height or weight on file to calculate BMI.  Orders:  No orders of the defined types were placed in this encounter.  No orders of the defined types were placed in this encounter.    Procedures: No procedures performed  Clinical Data: No additional findings.  ROS:  All other systems negative, except as noted in the HPI. Review of Systems  Objective: Vital Signs: There were no vitals taken for this visit.  Specialty Comments:  No specialty comments available.  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/07/2012   Cough 02/28/2011  Past Medical History:  Diagnosis Date   Allergic rhinitis    Hypertension    Seasonal allergies     Family History  Problem Relation Age of Onset   Hypertension Mother    Cancer Mother        skin   Hypertension Father    Cancer Father        skin    Past Surgical History:  Procedure Laterality Date   BREAST LUMPECTOMY WITH NEEDLE LOCALIZATION Right 07/30/2012   Procedure: RIGHT BREAST WIRE LOCALIZATION LUMPECTOMY ;  Surgeon: Merrie Roof, MD;  Location: [facility];  Service: General;  Laterality: Right;   CHOLECYSTECTOMY  07-09-2007   lap choli   DIAGNOSTIC LAPAROSCOPY  2010   ectopic preg rt   Social History   Occupational History   Not on file  Tobacco Use   Smoking  status: Never   Smokeless tobacco: Never  Substance and Sexual Activity   Alcohol use: No   Drug use: No   Sexual activity: Not on file

## 2021-03-16 ENCOUNTER — Ambulatory Visit: Payer: Managed Care, Other (non HMO) | Admitting: Orthopedic Surgery

## 2021-03-16 ENCOUNTER — Encounter: Payer: Self-pay | Admitting: Orthopedic Surgery

## 2021-03-16 DIAGNOSIS — M76821 Posterior tibial tendinitis, right leg: Secondary | ICD-10-CM

## 2021-03-16 NOTE — Progress Notes (Signed)
? ?Office Visit Note ?  ?Patient: [name]           ?Date of Birth: [birth year]           ?MRN: [id number] ?Visit Date: 03/16/2021 ?             ?Requested by: Haywood Pao, MD ?[address] ?[place],  [place] [address] ?PCP: Haywood Pao, MD ? ?Chief Complaint  ?Patient presents with  ? Right Foot - Follow-up  ? ? ? ? ?HPI: ?Patient is a 56 year old woman who presents in follow-up for posterior tibial tendon insufficiency right foot.  She has been in a fracture boot using Voltaren gel.  Patient states that she is still painful better than she was 7 weeks ago. ? ?Assessment & Plan: ?Visit Diagnoses:  ?1. Posterior tibial tendon dysfunction (PTTD) of right lower extremity   ? ? ?Plan: We will continue with the conservative therapy with Voltaren gel in the fracture boot.  Reevaluate in 4 weeks.  Discussed that if she gets to the point where she does not feel like she is making continued improvement that surgical intervention is an option.  Discussed that with talonavicular and subtalar fusion she would be off her foot for about 3 months. ? ?Follow-Up Instructions: Return in about 4 weeks (around 04/13/2021).  ? ?Ortho Exam ? ?Patient is alert, oriented, no adenopathy, well-dressed, normal affect, normal respiratory effort. ?Examination patient has a good pulse she has pes planus with a pronated valgus foot with a valgus hindfoot.  She cannot do a single limb heel raise she has swelling over the posterior tibial tendon and has tenderness to palpation. ? ?Imaging: ?No results found. ?No images are attached to the encounter. ? ?Labs: ?Lab Results  ?Component Value Date  ? HGBA1C  02/15/2009  ?  5.8 ?(NOTE) The ADA recommends the following therapeutic goal for glycemic control related to Hgb A1c measurement: Goal of therapy: <6.5 Hgb A1c  Reference: American Diabetes Association: Clinical Practice Recommendations 2010, Diabetes Care, 2010, 33: (Suppl ? 1).  ? ? ? ?Lab Results  ?Component Value Date  ? ALBUMIN  3.0 (L) 07/13/2007  ? ALBUMIN 3.2 (L) 07/12/2007  ? ALBUMIN 3.4 (L) 07/11/2007  ? ? ?No results found for: MG ?No results found for: VD25OH ? ?No results found for: PREALBUMIN ?CBC EXTENDED Latest Ref Rng & Units 07/30/2012 02/14/2009 05/26/2008  ?WBC 4.0 - 10.5 K/uL - 6.9 11.1(H)  ?RBC 3.87 - 5.11 MIL/uL - 4.48 3.12(L)  ?HGB 12.0 - 15.0 g/dL 13.4 12.8 9.7(L)  ?HCT 36.0 - 46.0 % - 37.3 27.3(L)  ?PLT 150 - 400 K/uL - 278 210  ?NEUTROABS 1.7 - 7.7 K/uL - 3.9 -  ?LYMPHSABS 0.7 - 4.0 K/uL - 2.5 -  ? ? ? ?There is no height or weight on file to calculate BMI. ? ?Orders:  ?No orders of the defined types were placed in this encounter. ? ?No orders of the defined types were placed in this encounter. ? ? ? Procedures: ?No procedures performed ? ?Clinical Data: ?No additional findings. ? ?ROS: ? ?All other systems negative, except as noted in the HPI. ?Review of Systems ? ?Objective: ?Vital Signs: There were no vitals taken for this visit. ? ?Specialty Comments:  ?No specialty comments available. ? ?PMFS History: ?Patient Active Problem List  ? Diagnosis Date Noted  ? Chronic venous insufficiency 02/23/2019  ? Varicose veins of both lower extremities 02/23/2019  ? Bilateral leg pain 02/23/2019  ? 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  Cough 02/28/2011  ? ?Past Medical History:  ?Diagnosis Date  ? Allergic rhinitis   ? Hypertension   ? Seasonal allergies   ?  ?Family History  ?Problem Relation Age of Onset  ? Hypertension Mother   ? Cancer Mother   ?     skin  ? Hypertension Father   ? Cancer Father   ?     skin  ?  ?Past Surgical History:  ?Procedure Laterality Date  ? BREAST LUMPECTOMY WITH NEEDLE LOCALIZATION Right 07/30/2012  ? Procedure: RIGHT BREAST WIRE LOCALIZATION LUMPECTOMY ;  Surgeon: Merrie Roof, MD;  Location: [facility];  Service: General;  Laterality: Right;  ? CHOLECYSTECTOMY  07-09-2007  ? lap choli  ? DIAGNOSTIC LAPAROSCOPY  2010  ? ectopic preg rt  ? ?Social History  ? ?Occupational History  ? Not on  file  ?Tobacco Use  ? Smoking status: Never  ? Smokeless tobacco: Never  ?Substance and Sexual Activity  ? Alcohol use: No  ? Drug use: No  ? Sexual activity: Not on file  ? ? ? ? ? ?

## 2021-03-27 ENCOUNTER — Encounter: Payer: Self-pay | Admitting: Orthopedic Surgery

## 2021-03-27 ENCOUNTER — Telehealth: Payer: Self-pay | Admitting: Orthopedic Surgery

## 2021-03-27 NOTE — Telephone Encounter (Signed)
Pt is calling to get a extended note. ? ?Please call the pt  ?

## 2021-03-28 ENCOUNTER — Telehealth: Payer: Self-pay | Admitting: Orthopedic Surgery

## 2021-03-28 NOTE — Telephone Encounter (Signed)
03/16/21 OV note faxed to [facility] [phone] per pts request ?

## 2021-03-30 NOTE — Telephone Encounter (Signed)
Records faxed.

## 2021-04-13 ENCOUNTER — Telehealth: Payer: Self-pay | Admitting: Orthopedic Surgery

## 2021-04-13 ENCOUNTER — Ambulatory Visit: Payer: Managed Care, Other (non HMO) | Admitting: Orthopedic Surgery

## 2021-04-13 DIAGNOSIS — M76821 Posterior tibial tendinitis, right leg: Secondary | ICD-10-CM

## 2021-04-13 NOTE — Telephone Encounter (Signed)
Received medical records release form and out of work note from [place] to [place] today ?

## 2021-04-14 ENCOUNTER — Encounter: Payer: Self-pay | Admitting: Orthopedic Surgery

## 2021-04-14 NOTE — Progress Notes (Signed)
? ?Office Visit Note ?  ?Patient: [name]           ?Date of Birth: February 19, 1965           ?MRN: [id number] ?Visit Date: 04/13/2021 ?             ?Requested by: Haywood Pao, MD ?[address] ?[place],  [place] [address] ?PCP: Haywood Pao, MD ? ?Chief Complaint  ?Patient presents with  ? Right Ankle - Follow-up  ? ? ? ? ?HPI: ?Patient is a 56 year old woman who presents in follow-up for posterior tibial tendon dysfunction.  Patient has used Voltaren gel in a fracture boot.  Patient states she has exhausted her conservative treatment options.  Patient states she cannot ambulate short distances without the fracture boot.  She works at [employer]. ? ?Assessment & Plan: ?Visit Diagnoses:  ?1. Posterior tibial tendon dysfunction (PTTD) of right lower extremity   ? ? ?Plan: Discussed with her the surgical treatment would be to proceed with a talonavicular and subtalar fusion.  Discussed that she could be out of work for 2 months.  Possibly returned to work at [employer] duty at 4 weeks.  Risks and benefits were discussed including infection persistent pain need for additional surgery.  Patient states she understands wished to proceed at this time. ? ?Follow-Up Instructions: Return in about 2 weeks (around 04/27/2021).  ? ?Ortho Exam ? ?Patient is alert, oriented, no adenopathy, well-dressed, normal affect, normal respiratory effort. ?Examination patient has a good pulse she has a pronated valgus foot with pes planus.  Patient cannot do a single limb heel raise pain is reproduced with palpation over the posterior tibial tendon. ? ?Imaging: ?No results found. ?No images are attached to the encounter. ? ?Labs: ?Lab Results  ?Component Value Date  ? HGBA1C  02/15/2009  ?  5.8 ?(NOTE) The ADA recommends the following therapeutic goal for glycemic control related to Hgb A1c measurement: Goal of therapy: <6.5 Hgb A1c  Reference: American Diabetes Association: Clinical Practice Recommendations 2010, Diabetes  Care, 2010, 33: (Suppl ? 1).  ? ? ? ?Lab Results  ?Component Value Date  ? ALBUMIN 3.0 (L) 07/13/2007  ? ALBUMIN 3.2 (L) 07/12/2007  ? ALBUMIN 3.4 (L) 07/11/2007  ? ? ?No results found for: MG ?No results found for: VD25OH ? ?No results found for: PREALBUMIN ? ?  Latest Ref Rng & Units 07/30/2012  ?  9:07 AM 02/14/2009  ?  9:12 PM 05/26/2008  ? 12:12 PM  ?CBC EXTENDED  ?WBC 4.0 - 10.5 K/uL  6.9   11.1    ?RBC 3.87 - 5.11 MIL/uL  4.48   3.12    ?Hemoglobin 12.0 - 15.0 g/dL 13.4   12.8   9.7    ?HCT 36.0 - 46.0 %  37.3   27.3    ?Platelets 150 - 400 K/uL  278   210    ?NEUT# 1.7 - 7.7 K/uL  3.9     ?Lymph# 0.7 - 4.0 K/uL  2.5     ? ? ? ?There is no height or weight on file to calculate BMI. ? ?Orders:  ?No orders of the defined types were placed in this encounter. ? ?No orders of the defined types were placed in this encounter. ? ? ? Procedures: ?No procedures performed ? ?Clinical Data: ?No additional findings. ? ?ROS: ? ?All other systems negative, except as noted in the HPI. ?Review of Systems ? ?Objective: ?Vital Signs: There were no vitals taken for  this visit. ? ?Specialty Comments:  ?No specialty comments available. ? ?PMFS History: ?Patient Active Problem List  ? Diagnosis Date Noted  ? Chronic venous insufficiency 02/23/2019  ? Varicose veins of both lower extremities 02/23/2019  ? Bilateral leg pain 02/23/2019  ? 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 Cough 02/28/2011  ? ?Past Medical History:  ?Diagnosis Date  ? Allergic rhinitis   ? Hypertension   ? Seasonal allergies   ?  ?Family History  ?Problem Relation Age of Onset  ? Hypertension Mother   ? Cancer Mother   ?     skin  ? Hypertension Father   ? Cancer Father   ?     skin  ?  ?Past Surgical History:  ?Procedure Laterality Date  ? BREAST LUMPECTOMY WITH NEEDLE LOCALIZATION Right 07/30/2012  ? Procedure: RIGHT BREAST WIRE LOCALIZATION LUMPECTOMY ;  Surgeon: Merrie Roof, MD;  Location: [facility];  Service: General;  Laterality: Right;  ?  CHOLECYSTECTOMY  07-09-2007  ? lap choli  ? DIAGNOSTIC LAPAROSCOPY  2010  ? ectopic preg rt  ? ?Social History  ? ?Occupational History  ? Not on file  ?Tobacco Use  ? Smoking status: Never  ? Smokeless tobacco: Never  ?Substance and Sexual Activity  ? Alcohol use: No  ? Drug use: No  ? Sexual activity: Not on file  ? ? ? ? ? ?

## 2021-04-26 ENCOUNTER — Other Ambulatory Visit: Payer: Self-pay | Admitting: Family

## 2021-05-10 ENCOUNTER — Other Ambulatory Visit: Payer: Self-pay

## 2021-05-10 ENCOUNTER — Encounter (HOSPITAL_COMMUNITY): Payer: Self-pay | Admitting: Orthopedic Surgery

## 2021-05-10 NOTE — Progress Notes (Signed)
PCP - Dr. Osborne Casco ? ?Cardiologist - Denies ? ?EP- Denies ? ?Endocrine- Denies ? ?Pulm- Denies ? ?Chest x-ray - Denies ? ?EKG - 05/12/21 -Day of surgery ? ?Stress Test - 11/02/15 (E) ? ?ECHO - Denies ? ?Cardiac Cath - Denies ? ?AICD-na ?PM-na ?LOOP-na ? ?Nerve Stimulator- Denies ? ?Dialysis- Denies ? ?Sleep Study - Denies ?CPAP - Denies ? ?LABS- 05/12/21: CBC, BMP, POC UPreg ? ?ASA- Denies ? ?ERAS- Yes clears until [id number] ? ?HA1C- Denies ? ?Anesthesia- no ? ?Pt denies having chest pain, sob, or fever during the pre-op phone call. All instructions explained to the pt, with a verbal understanding of the material including: as of today, stop taking all Aspirin (unless instructed by your doctor) and Other Aspirin containing products, Vitamins, Fish oils, and Herbal medications. Also stop all NSAIDS i.e. Advil, Ibuprofen, Motrin, Aleve, Anaprox, Naproxen, BC, Goody Powders, and all Supplements.  Pt also instructed to wear a mask and social distance if she goes out. The opportunity to ask questions was provided.  ?

## 2021-05-11 NOTE — Anesthesia Preprocedure Evaluation (Addendum)
Anesthesia Evaluation  ?Patient identified by MRN, date of birth, ID band ?Patient awake ? ? ? ?Reviewed: ?Allergy & Precautions, NPO status , Patient's Chart, lab work & pertinent test results ? ?History of Anesthesia Complications ?Negative for: history of anesthetic complications ? ?Airway ?Mallampati: II ? ?TM Distance: >3 FB ?Neck ROM: Full ? ? ? Dental ?no notable dental hx. ? ?  ?Pulmonary ?neg pulmonary ROS,  ?  ?Pulmonary exam normal ? ? ? ? ? ? ? Cardiovascular ?hypertension, Pt. on medications ?Normal cardiovascular exam ? ? ?  ?Neuro/Psych ?Anxiety S/p cervical fusion ?  ? GI/Hepatic ?negative GI ROS, Neg liver ROS,   ?Endo/Other  ?negative endocrine ROS ? Renal/GU ?negative Renal ROS  ?negative genitourinary ?  ?Musculoskeletal ?negative musculoskeletal ROS ?(+)  ? Abdominal ?  ?Peds ? Hematology ?negative hematology ROS ?(+)   ?Anesthesia Other Findings ?Day of surgery medications reviewed with patient. ? Reproductive/Obstetrics ?negative OB ROS ? ?  ? ? ? ? ? ? ? ? ? ? ? ? ? ?  ?  ? ? ? ? ? ? ? ?Anesthesia Physical ?Anesthesia Plan ? ?ASA: 3 ? ?Anesthesia Plan: General  ? ?Post-op Pain Management: Tylenol PO (pre-op)* and Regional block*  ? ?Induction: Intravenous ? ?PONV Risk Score and Plan: 3 and Treatment may vary due to age or medical condition, Midazolam, Ondansetron and Dexamethasone ? ?Airway Management Planned: LMA ? ?Additional Equipment: None ? ?Intra-op Plan:  ? ?Post-operative Plan: Extubation in OR ? ?Informed Consent: I have reviewed the patients History and Physical, chart, labs and discussed the procedure including the risks, benefits and alternatives for the proposed anesthesia with the patient or authorized representative who has indicated his/her understanding and acceptance.  ? ? ? ?Dental advisory given ? ?Plan Discussed with: CRNA ? ?Anesthesia Plan Comments:   ? ? ? ? ? ?Anesthesia Quick Evaluation ? ?

## 2021-05-12 ENCOUNTER — Ambulatory Visit (HOSPITAL_COMMUNITY): Payer: Managed Care, Other (non HMO)

## 2021-05-12 ENCOUNTER — Ambulatory Visit (HOSPITAL_BASED_OUTPATIENT_CLINIC_OR_DEPARTMENT_OTHER): Payer: Managed Care, Other (non HMO) | Admitting: Anesthesiology

## 2021-05-12 ENCOUNTER — Ambulatory Visit (HOSPITAL_COMMUNITY): Payer: Managed Care, Other (non HMO) | Admitting: Anesthesiology

## 2021-05-12 ENCOUNTER — Other Ambulatory Visit: Payer: Self-pay

## 2021-05-12 ENCOUNTER — Ambulatory Visit (HOSPITAL_COMMUNITY)
Admission: RE | Admit: 2021-05-12 | Discharge: 2021-05-12 | Disposition: A | Discharge status: Home / Self Care | Payer: Managed Care, Other (non HMO) | Attending: Orthopedic Surgery | Admitting: Orthopedic Surgery

## 2021-05-12 ENCOUNTER — Encounter (HOSPITAL_COMMUNITY): Payer: Self-pay | Admitting: Orthopedic Surgery

## 2021-05-12 ENCOUNTER — Encounter (HOSPITAL_COMMUNITY)
Admission: RE | Disposition: A | Discharge status: Home / Self Care | Payer: Self-pay | Source: Home / Self Care | Attending: Orthopedic Surgery

## 2021-05-12 DIAGNOSIS — I10 Essential (primary) hypertension: Secondary | ICD-10-CM | POA: Insufficient documentation

## 2021-05-12 DIAGNOSIS — M76821 Posterior tibial tendinitis, right leg: Secondary | ICD-10-CM | POA: Diagnosis not present

## 2021-05-12 DIAGNOSIS — F419 Anxiety disorder, unspecified: Secondary | ICD-10-CM | POA: Diagnosis not present

## 2021-05-12 DIAGNOSIS — M67873 Other specified disorders of tendon, right ankle and foot: Secondary | ICD-10-CM | POA: Diagnosis present

## 2021-05-12 HISTORY — DX: Anxiety disorder, unspecified: F41.9

## 2021-05-12 HISTORY — PX: FOOT ARTHRODESIS: SHX1655

## 2021-05-12 HISTORY — DX: Panic disorder (episodic paroxysmal anxiety): F41.0

## 2021-05-12 LAB — BASIC METABOLIC PANEL
Anion gap: 8 (ref 5–15)
BUN: 11 mg/dL (ref 6–20)
CO2: 26 mmol/L (ref 22–32)
Calcium: 9.4 mg/dL (ref 8.9–10.3)
Chloride: 103 mmol/L (ref 98–111)
Creatinine, Ser: 0.79 mg/dL (ref 0.44–1.00)
GFR, Estimated: >60 mL/min (ref >60)
Glucose, Bld: 123 mg/dL — ABNORMAL HIGH (ref 70–99)
Potassium: 3.3 mmol/L — ABNORMAL LOW (ref 3.5–5.1)
Sodium: 137 mmol/L (ref 135–145)

## 2021-05-12 LAB — CBC
HCT: 42.8 % (ref 36.0–46.0)
Hemoglobin: 14.4 g/dL (ref 12.0–15.0)
MCH: 29.5 pg (ref 26.0–34.0)
MCHC: 33.6 g/dL (ref 30.0–36.0)
MCV: 87.7 fL (ref 80.0–100.0)
Platelets: 274 10*3/uL (ref 150–400)
RBC: 4.88 MIL/uL (ref 3.87–5.11)
RDW: 13.5 % (ref 11.5–15.5)
WBC: 6.1 10*3/uL (ref 4.0–10.5)
nRBC: 0 % (ref 0.0–0.2)

## 2021-05-12 LAB — POCT PREGNANCY, URINE: Preg Test, Ur: NEGATIVE

## 2021-05-12 SURGERY — FUSION, JOINT, FOOT
Anesthesia: General | Site: Foot | Laterality: Right

## 2021-05-12 MED ORDER — 0.9 % SODIUM CHLORIDE (POUR BTL) OPTIME
TOPICAL | Status: DC | PRN
  Administered 2021-05-12: 1000 mL

## 2021-05-12 MED ORDER — LIDOCAINE 2% (20 MG/ML) 5 ML SYRINGE
INTRAMUSCULAR | Status: DC | PRN
  Administered 2021-05-12: 100 mg via INTRAVENOUS

## 2021-05-12 MED ORDER — OXYCODONE HCL 5 MG/5ML PO SOLN: 5.0000 mg | Freq: Once | ORAL | Status: AC | PRN

## 2021-05-12 MED ORDER — PROPOFOL 10 MG/ML IV BOLUS
INTRAVENOUS | Status: AC
  Filled 2021-05-12: qty 20

## 2021-05-12 MED ORDER — CEFAZOLIN SODIUM 1 G IJ SOLR
INTRAMUSCULAR | Status: AC
  Filled 2021-05-12: qty 10

## 2021-05-12 MED ORDER — ACETAMINOPHEN 500 MG PO TABS
1000.0000 mg | ORAL_TABLET | Freq: Once | ORAL | Status: AC
  Administered 2021-05-12: 1000 mg via ORAL
  Filled 2021-05-12: qty 2

## 2021-05-12 MED ORDER — CHLORHEXIDINE GLUCONATE 0.12 % MT SOLN
15.0000 mL | Freq: Once | OROMUCOSAL | Status: AC
  Administered 2021-05-12: 15 mL via OROMUCOSAL
  Filled 2021-05-12: qty 15

## 2021-05-12 MED ORDER — ORAL CARE MOUTH RINSE: 15.0000 mL | Freq: Once | OROMUCOSAL | Status: AC

## 2021-05-12 MED ORDER — BUPIVACAINE-EPINEPHRINE (PF) 0.5% -1:200000 IJ SOLN
INTRAMUSCULAR | Status: DC | PRN
  Administered 2021-05-12: 10 mL via PERINEURAL
  Administered 2021-05-12: 20 mL via PERINEURAL

## 2021-05-12 MED ORDER — OXYCODONE HCL 5 MG PO TABS
ORAL_TABLET | ORAL | Status: AC
  Filled 2021-05-12: qty 1

## 2021-05-12 MED ORDER — OXYCODONE HCL 5 MG PO TABS
5.0000 mg | ORAL_TABLET | Freq: Once | ORAL | Status: AC | PRN
  Administered 2021-05-12: 5 mg via ORAL

## 2021-05-12 MED ORDER — FENTANYL CITRATE (PF) 100 MCG/2ML IJ SOLN
INTRAMUSCULAR | Status: AC
  Administered 2021-05-12: 50 ug via INTRAVENOUS
  Filled 2021-05-12: qty 2

## 2021-05-12 MED ORDER — CEFAZOLIN IN SODIUM CHLORIDE 3-0.9 GM/100ML-% IV SOLN
3.0000 g | INTRAVENOUS | Status: AC
Start: 2021-05-12 — End: 2021-05-12
  Administered 2021-05-12: 3 g via INTRAVENOUS
  Filled 2021-05-12: qty 100

## 2021-05-12 MED ORDER — LIDOCAINE 2% (20 MG/ML) 5 ML SYRINGE
INTRAMUSCULAR | Status: AC
  Filled 2021-05-12: qty 5

## 2021-05-12 MED ORDER — PROPOFOL 10 MG/ML IV BOLUS
INTRAVENOUS | Status: DC | PRN
  Administered 2021-05-12: 200 mg via INTRAVENOUS

## 2021-05-12 MED ORDER — OXYCODONE-ACETAMINOPHEN 5-325 MG PO TABS: 1.0000 | ORAL_TABLET | ORAL | 0 refills | Status: DC | PRN

## 2021-05-12 MED ORDER — ONDANSETRON HCL 4 MG/2ML IJ SOLN
INTRAMUSCULAR | Status: AC
  Filled 2021-05-12: qty 2

## 2021-05-12 MED ORDER — MIDAZOLAM HCL 2 MG/2ML IJ SOLN
INTRAMUSCULAR | Status: AC
  Filled 2021-05-12: qty 2

## 2021-05-12 MED ORDER — FENTANYL CITRATE (PF) 100 MCG/2ML IJ SOLN: 25.0000 ug | INTRAMUSCULAR | Status: DC | PRN

## 2021-05-12 MED ORDER — MIDAZOLAM HCL 2 MG/2ML IJ SOLN
INTRAMUSCULAR | Status: AC
  Administered 2021-05-12: 1 mg via INTRAVENOUS
  Filled 2021-05-12: qty 2

## 2021-05-12 MED ORDER — LACTATED RINGERS IV SOLN: INTRAVENOUS | Status: DC

## 2021-05-12 MED ORDER — FENTANYL CITRATE (PF) 250 MCG/5ML IJ SOLN
INTRAMUSCULAR | Status: AC
  Filled 2021-05-12: qty 5

## 2021-05-12 MED ORDER — PHENYLEPHRINE 80 MCG/ML (10ML) SYRINGE FOR IV PUSH (FOR BLOOD PRESSURE SUPPORT)
PREFILLED_SYRINGE | INTRAVENOUS | Status: AC
  Filled 2021-05-12: qty 10

## 2021-05-12 MED ORDER — MIDAZOLAM HCL 2 MG/2ML IJ SOLN: 1.0000 mg | Freq: Once | INTRAMUSCULAR | Status: AC

## 2021-05-12 MED ORDER — FENTANYL CITRATE (PF) 100 MCG/2ML IJ SOLN: 50.0000 ug | Freq: Once | INTRAMUSCULAR | Status: AC

## 2021-05-12 MED ORDER — EPHEDRINE SULFATE-NACL 50-0.9 MG/10ML-% IV SOSY
PREFILLED_SYRINGE | INTRAVENOUS | Status: DC | PRN
  Administered 2021-05-12 (×2): 10 mg via INTRAVENOUS

## 2021-05-12 MED ORDER — ONDANSETRON HCL 4 MG/2ML IJ SOLN
INTRAMUSCULAR | Status: DC | PRN
  Administered 2021-05-12: 4 mg via INTRAVENOUS

## 2021-05-12 MED ORDER — PHENYLEPHRINE HCL (PRESSORS) 10 MG/ML IV SOLN
INTRAVENOUS | Status: DC | PRN
  Administered 2021-05-12: 160 ug via INTRAVENOUS
  Administered 2021-05-12: 80 ug via INTRAVENOUS
  Administered 2021-05-12: 160 ug via INTRAVENOUS
  Administered 2021-05-12: 80 ug via INTRAVENOUS
  Administered 2021-05-12: 160 ug via INTRAVENOUS
  Administered 2021-05-12: 80 ug via INTRAVENOUS

## 2021-05-12 MED ORDER — CLONIDINE HCL (ANALGESIA) 100 MCG/ML EP SOLN
EPIDURAL | Status: DC | PRN
  Administered 2021-05-12: 67 ug
  Administered 2021-05-12: 33 ug

## 2021-05-12 MED ORDER — FENTANYL CITRATE (PF) 250 MCG/5ML IJ SOLN
INTRAMUSCULAR | Status: DC | PRN
Start: 2021-05-12 — End: 2021-05-12
  Administered 2021-05-12 (×4): 25 ug via INTRAVENOUS

## 2021-05-12 MED ORDER — EPHEDRINE 5 MG/ML INJ
INTRAVENOUS | Status: AC
  Filled 2021-05-12: qty 5

## 2021-05-12 SURGICAL SUPPLY — 53 items
ASMB FX 20X17 STPL INSRTR (Staple) ×1 IMPLANT
BAG COUNTER SPONGE SURGICOUNT (BAG) ×2 IMPLANT
BAG SPNG CNTER NS LX DISP (BAG) ×1
BANDAGE ESMARK 6X9 LF (GAUZE/BANDAGES/DRESSINGS) IMPLANT
BIT DRILL 4.5 TYB 9 (BIT) ×1 IMPLANT
BLADE SAW SGTL HD 18.5X60.5X1. (BLADE) ×2 IMPLANT
BLADE SURG 10 STRL SS (BLADE) IMPLANT
BNDG CMPR 9X6 STRL LF SNTH (GAUZE/BANDAGES/DRESSINGS)
BNDG COHESIVE 4X5 TAN ST LF (GAUZE/BANDAGES/DRESSINGS) ×1 IMPLANT
BNDG COHESIVE 4X5 TAN STRL (GAUZE/BANDAGES/DRESSINGS) ×2 IMPLANT
BNDG ESMARK 6X9 LF (GAUZE/BANDAGES/DRESSINGS)
BNDG GAUZE ELAST 4 BULKY (GAUZE/BANDAGES/DRESSINGS) ×4 IMPLANT
BUR SURG 4X8 MED (BURR) IMPLANT
BURR SURG 4X8 MED (BURR) ×2
COTTON STERILE ROLL (GAUZE/BANDAGES/DRESSINGS) ×2 IMPLANT
COVER MAYO STAND STRL (DRAPES) IMPLANT
COVER SURGICAL LIGHT HANDLE (MISCELLANEOUS) ×4 IMPLANT
DRAPE INCISE IOBAN 66X45 STRL (DRAPES) ×2 IMPLANT
DRAPE OEC MINIVIEW 54X84 (DRAPES) IMPLANT
DRAPE U-SHAPE 47X51 STRL (DRAPES) ×2 IMPLANT
DRSG ADAPTIC 3X8 NADH LF (GAUZE/BANDAGES/DRESSINGS) ×2 IMPLANT
DURAPREP 26ML APPLICATOR (WOUND CARE) ×2 IMPLANT
ELECT REM PT RETURN 9FT ADLT (ELECTROSURGICAL) ×2
ELECTRODE REM PT RTRN 9FT ADLT (ELECTROSURGICAL) ×1 IMPLANT
GAUZE PAD ABD 8X10 STRL (GAUZE/BANDAGES/DRESSINGS) ×1 IMPLANT
GAUZE SPONGE 4X4 12PLY STRL (GAUZE/BANDAGES/DRESSINGS) ×2 IMPLANT
GLOVE BIOGEL PI IND STRL 9 (GLOVE) ×1 IMPLANT
GLOVE BIOGEL PI INDICATOR 9 (GLOVE) ×1
GLOVE SURG ORTHO 9.0 STRL STRW (GLOVE) ×2 IMPLANT
GOWN STRL REUS W/ TWL XL LVL3 (GOWN DISPOSABLE) ×3 IMPLANT
GOWN STRL REUS W/TWL XL LVL3 (GOWN DISPOSABLE) ×6
GRAFT SKIN WND MICRO 38 (Tissue) ×1 IMPLANT
GUIDEWIRE TYB 2X9 (WIRE) ×2 IMPLANT
KIT BASIN OR (CUSTOM PROCEDURE TRAY) ×2 IMPLANT
KIT STAPLE ARCUS 20X17 STRL (Staple) ×1 IMPLANT
KIT TURNOVER KIT B (KITS) ×2 IMPLANT
MANIFOLD NEPTUNE II (INSTRUMENTS) ×2 IMPLANT
NS IRRIG 1000ML POUR BTL (IV SOLUTION) ×2 IMPLANT
PACK ORTHO EXTREMITY (CUSTOM PROCEDURE TRAY) ×2 IMPLANT
PAD ARMBOARD 7.5X6 YLW CONV (MISCELLANEOUS) ×4 IMPLANT
PAD CAST 4YDX4 CTTN HI CHSV (CAST SUPPLIES) ×1 IMPLANT
PADDING CAST COTTON 4X4 STRL (CAST SUPPLIES) ×2
PUTTY DBM STAGRAFT PLUS 2CC (Putty) ×1 IMPLANT
SCREW CANN HDLS SHRT 6.5X75 (Screw) ×2 IMPLANT
SPONGE T-LAP 18X18 ~~LOC~~+RFID (SPONGE) ×2 IMPLANT
STAPLE ASSEMBLY ARCUS 20X17 (Staple) ×1 IMPLANT
SUCTION FRAZIER HANDLE 10FR (MISCELLANEOUS) ×2
SUCTION TUBE FRAZIER 10FR DISP (MISCELLANEOUS) ×1 IMPLANT
SUT ETHILON 2 0 PSLX (SUTURE) ×6 IMPLANT
TOWEL GREEN STERILE (TOWEL DISPOSABLE) ×2 IMPLANT
TOWEL GREEN STERILE FF (TOWEL DISPOSABLE) ×2 IMPLANT
TUBE CONNECTING 12X1/4 (SUCTIONS) ×2 IMPLANT
WATER STERILE IRR 1000ML POUR (IV SOLUTION) ×2 IMPLANT

## 2021-05-12 NOTE — Transfer of Care (Signed)
Immediate Anesthesia Transfer of Care Note ? ?Patient: Shayonna Ocampo ? ?Procedure(s) Performed: RIGHT SUBTALAR AND TALONAVICULAR FUSION (Right: Foot) ? ?Patient Location: PACU ? ?Anesthesia Type:General ? ?Level of Consciousness: awake, drowsy, patient cooperative and responds to stimulation ? ?Airway & Oxygen Therapy: Patient Spontanous Breathing and Patient connected to nasal cannula oxygen ? ?Post-op Assessment: Report given to RN and Post -op Vital signs reviewed and stable ? ?Post vital signs: Reviewed and stable ? ?Last Vitals:  ?Vitals Value Taken Time  ?BP    ?Temp    ?Pulse 76 05/12/21 1105  ?Resp 10 05/12/21 1105  ?SpO2 100 % 05/12/21 1105  ?Vitals shown include unvalidated device data. ? ?Last Pain:  ?Vitals:  ? 05/12/21 0801  ?TempSrc:   ?PainSc: 0-No pain  ?   ? ?Patients Stated Pain Goal: 2 (05/10/21 1912) ? ?Complications: No notable events documented. ?

## 2021-05-12 NOTE — H&P (Signed)
[name] is an 56 y.o. female.   ?Chief Complaint: Right foot pain with posterior tibial tendon dysfunction ?HPI: Patient is a 56 year old woman who presents in follow-up for posterior tibial tendon dysfunction.  Patient has used Voltaren gel in a fracture boot.  Patient states she has exhausted her conservative treatment options.  Patient states she cannot ambulate short distances without the fracture boot.  She works at [employer]. ? ?Past Medical History:  ?Diagnosis Date  ? Allergic rhinitis   ? Anxiety   ? Hypertension   ? Panic attacks   ? Seasonal allergies   ? ? ?Past Surgical History:  ?Procedure Laterality Date  ? ANTERIOR FUSION CERVICAL SPINE    ? BREAST LUMPECTOMY WITH NEEDLE LOCALIZATION Right 07/30/2012  ? Procedure: RIGHT BREAST WIRE LOCALIZATION LUMPECTOMY ;  Surgeon: Merrie Roof, MD;  Location: [place];  Service: General;  Laterality: Right;  ? BREAST SURGERY    ? CHOLECYSTECTOMY  07/09/2007  ? lap choli  ? DIAGNOSTIC LAPAROSCOPY  01/09/2008  ? ectopic preg rt  ? ? ?Family History  ?Problem Relation Age of Onset  ? Hypertension Mother   ? Cancer Mother   ?     skin  ? Hypertension Father   ? Cancer Father   ?     skin  ? ?Social History:  reports that she has never smoked. She has never used smokeless tobacco. She reports that she does not drink alcohol and does not use drugs. ? ?Allergies:  ?Allergies  ?Allergen Reactions  ? Hydrocodone Anaphylaxis  ?  Couldn't move arms and couldn't walk  ? Avelox [Moxifloxacin Hcl In Nacl]   ?  Nervous ?  ? Codeine   ?  Nervous/anxious   ? ? ?No medications prior to admission.  ? ? ?No results found for this or any previous visit (from the past 48 hour(s)). ?No results found. ? ?Review of Systems  ?All other systems reviewed and are negative. ? ?Height '5\' 7"'$  (1.702 m), weight 122.5 kg, last menstrual period 04/19/2021. ?Physical Exam  ?Patient is alert, oriented, no adenopathy, well-dressed, normal affect, normal  respiratory effort. ?Examination patient has a good pulse she has a pronated valgus foot with pes planus.  Patient cannot do a single limb heel raise pain is reproduced with palpation over the posterior tibial tendon. ?Assessment/Plan ?1. Posterior tibial tendon dysfunction (PTTD) of right lower extremity   ?  ?  ?Plan: Discussed with her the surgical treatment would be to proceed with a talonavicular and subtalar fusion.  Discussed that she could be out of work for 2 months.  Possibly returned to work at [employer] duty at 4 weeks.  Risks and benefits were discussed including infection persistent pain need for additional surgery.  Patient states she understands wished to proceed at this time. ? ?Newt Minion, MD ?05/12/2021, 6:40 AM ? ? ? ?

## 2021-05-12 NOTE — Op Note (Signed)
05/12/2021 ? ?11:07 AM ? ?PATIENT:  [name]   ? ?PRE-OPERATIVE DIAGNOSIS:  Posterior Tibial Tendon Dysfunction ? ?POST-OPERATIVE DIAGNOSIS:  Same ? ?PROCEDURE:  RIGHT SUBTALAR AND TALONAVICULAR FUSION ?Application of 5 cc stay graft. ?Application of Kerecis tissue graft 38 cm?. ?C arm fluoroscopy to verify reduction. ? ?SURGEON:  Newt Minion, MD ? ?PHYSICIAN ASSISTANT:None ?ANESTHESIA:   General ? ?PREOPERATIVE INDICATIONS:  [name] is a  56 y.o. female with a diagnosis of Posterior Tibial Tendon Dysfunction who failed conservative measures and elected for surgical management.   ? ?The risks benefits and alternatives were discussed with the patient preoperatively including but not limited to the risks of infection, bleeding, nerve injury, cardiopulmonary complications, the need for revision surgery, among others, and the patient was willing to proceed. ? ?OPERATIVE IMPLANTS: Kerecis micro tissue graft 38 cm?Marland Kitchen ?Stay graft bone matrix 2 cc. ? ?'@ENCIMAGES'$ @ ? ?OPERATIVE FINDINGS: C-arm fluoroscopy showed stable reduction of the subtalar and talonavicular fusion. ? ?OPERATIVE PROCEDURE: Patient brought the operating room after undergoing a regional block that she then underwent a general anesthetic.  After adequate levels anesthesia were obtained patient's right lower extremity was prepped using DuraPrep draped into a sterile field a timeout was called.  A oblique incision was made over the sinus Tarsi.  Retractors were placed to protect the dorsal neurovascular structures and the peroneal tendons.  The joint was debrided and a rem B bur was used to debride articular cartilage from the posterior facet.  This was then further debrided with a curette.  There was good bleeding subchondral bone on both joint surfaces.  This was then filled with 1 cc of bone graft.  Attention was then focused on the talonavicular joint.  A dorsal incision was made this was carried down through the interval of the anterior tibial  tendon and the EHL tendon.  Retractors were placed the talonavicular joint was identified the bur was used to debride the articular cartilage from the joint.  This was further debrided with a curette.  There was good bleeding subchondral bone.  This was then packed with 1 cc of the bone graft.  The foot was reduced and the talonavicular joint was stabilized with 2 staples.  The subtalar joint was then stabilized with 2 K wires C-arm possibly verified reduction and 275 mm headless cannulated screws were used to stabilize the subtalar joint.  C-arm fluoroscopy verified alignment.  There was good range of motion of the ankle.  Hindfoot was in slight valgus.  The fusion site was then packed with Kerecis micro powder this was split evenly from the talonavicular and subtalar fusion.  The skin was closed using 2-0 nylon.  Sterile dressing was applied patient was taken the PACU in stable condition. ? ? ?DISCHARGE PLANNING: ? ?Antibiotic duration: Preoperative antibiotics ? ?Weightbearing: Nonweightbearing on the right with a fracture boot ? ?Pain medication: Prescription for Percocet ? ?Dressing care/ Wound VAC: Follow-up in the office 1 week to change the dressing ? ?Ambulatory devices: Patient states she will have a kneeling scooter ? ?Discharge to: Home. ? ?Follow-up: In the office 1 week post operative. ? ? ? ? ? ? ?  ?

## 2021-05-12 NOTE — Anesthesia Procedure Notes (Signed)
Procedure Name: LMA Insertion ?Date/Time: 05/12/2021 10:01 AM ?Performed by: Cathren Harsh, CRNA ?Pre-anesthesia Checklist: Patient identified, Emergency Drugs available, Suction available and Patient being monitored ?Patient Re-evaluated:Patient Re-evaluated prior to induction ?Oxygen Delivery Method: Circle System Utilized ?Preoxygenation: Pre-oxygenation with 100% oxygen ?Induction Type: IV induction ?Ventilation: Mask ventilation without difficulty ?LMA: LMA inserted ?LMA Size: 4.0 ?Number of attempts: 1 ?Airway Equipment and Method: Bite block ?Placement Confirmation: positive ETCO2 ?Tube secured with: Tape ?Dental Injury: Teeth and Oropharynx as per pre-operative assessment  ? ? ? ? ?

## 2021-05-12 NOTE — Anesthesia Procedure Notes (Signed)
Anesthesia Regional Block: Adductor canal block  ? ?Pre-Anesthetic Checklist: , timeout performed,  Correct Patient, Correct Site, Correct Laterality,  Correct Procedure, Correct Position, site marked,  Risks and benefits discussed,  Pre-op evaluation,  At surgeon's request and post-op pain management ? ?Laterality: Right ? ?Prep: Maximum Sterile Barrier Precautions used, chloraprep     ?  ?Needles:  ?Injection technique: Single-shot ? ?Needle Type: Echogenic Stimulator Needle   ? ? ?Needle Length: 9cm  ?Needle Gauge: 22  ? ? ? ?Additional Needles: ? ? ?Procedures:,,,, ultrasound used (permanent image in chart),,    ?Narrative:  ?Start time: 05/12/2021 9:02 AM ?End time: 05/12/2021 9:05 AM ?Injection made incrementally with aspirations every 5 mL. ? ?Performed by: Personally  ?Anesthesiologist: Brennan Bailey, MD ? ?Additional Notes: ?Risks, benefits, and alternative discussed. Patient gave consent for procedure. Patient prepped and draped in sterile fashion. Sedation administered, patient remains easily responsive to voice. Relevant anatomy identified with ultrasound guidance. Local anesthetic given in 5cc increments with no signs or symptoms of intravascular injection. No pain or paraesthesias with injection. Patient monitored throughout procedure with signs of LAST or immediate complications. Tolerated well. Ultrasound image placed in chart.  ?Tawny Asal, MD ? ? ? ? ? ? ?

## 2021-05-12 NOTE — Progress Notes (Signed)
Orthopedic Tech Progress Note ?Patient Details:  ?[name] ?21-Feb-1965 ?[id number] ? ?Ortho Devices ?Type of Ortho Device: Crutches, CAM walker ?Ortho Device/Splint Location: LLE ?Ortho Device/Splint Interventions: Ordered, Application, Adjustment ?  ?Post Interventions ?Patient Tolerated: Well ? ?[name] A [name] ?05/12/2021, 12:06 PM ? ?

## 2021-05-12 NOTE — Anesthesia Procedure Notes (Signed)
Anesthesia Regional Block: Popliteal block  ? ?Pre-Anesthetic Checklist: , timeout performed,  Correct Patient, Correct Site, Correct Laterality,  Correct Procedure, Correct Position, site marked,  Risks and benefits discussed,  Pre-op evaluation,  At surgeon's request and post-op pain management ? ?Laterality: Right ? ?Prep: Maximum Sterile Barrier Precautions used, chloraprep     ?  ?Needles:  ?Injection technique: Single-shot ? ?Needle Type: Echogenic Stimulator Needle   ? ? ?Needle Length: 9cm  ?Needle Gauge: 22  ? ? ? ?Additional Needles: ? ? ?Procedures:,,,, ultrasound used (permanent image in chart),,    ?Narrative:  ?Start time: 05/12/2021 9:05 AM ?End time: 05/12/2021 9:07 AM ?Injection made incrementally with aspirations every 5 mL. ? ?Performed by: Personally  ?Anesthesiologist: Brennan Bailey, MD ? ?Additional Notes: ?Risks, benefits, and alternative discussed. Patient gave consent for procedure. Patient prepped and draped in sterile fashion. Sedation administered, patient remains easily responsive to voice. Relevant anatomy identified with ultrasound guidance. Local anesthetic given in 5cc increments with no signs or symptoms of intravascular injection. No pain or paraesthesias with injection. Patient monitored throughout procedure with signs of LAST or immediate complications. Tolerated well. Ultrasound image placed in chart.  ?Tawny Asal, MD ? ? ? ? ? ? ?

## 2021-05-12 NOTE — Anesthesia Postprocedure Evaluation (Signed)
Anesthesia Post Note ? ?Patient: [name] ? ?Procedure(s) Performed: RIGHT SUBTALAR AND TALONAVICULAR FUSION (Right: Foot) ? ?  ? ?Patient location during evaluation: PACU ?Anesthesia Type: General ?Level of consciousness: awake and alert ?Pain management: pain level controlled ?Vital Signs Assessment: post-procedure vital signs reviewed and stable ?Respiratory status: spontaneous breathing, nonlabored ventilation and respiratory function stable ?Cardiovascular status: blood pressure returned to baseline ?Postop Assessment: no apparent nausea or vomiting ?Anesthetic complications: no ? ? ?No notable events documented. ? ?Last Vitals:  ?Vitals:  ? 05/12/21 1120 05/12/21 1135  ?BP: 111/68 106/66  ?Pulse: 73 71  ?Resp: 15 19  ?Temp:  36.6 ?C  ?SpO2: 96% 96%  ?  ?Last Pain:  ?Vitals:  ? 05/12/21 1135  ?TempSrc:   ?PainSc: 5   ? ? ?  ?  ?  ?  ?  ?  ? ?Marthenia Rolling ? ? ? ? ?

## 2021-05-12 NOTE — Interval H&P Note (Signed)
History and Physical Interval Note: ? ?05/12/2021 ?9:40 AM ? ?[name]  has presented today for surgery, with the diagnosis of Posterior Tibial Tendon Dysfunction.  The various methods of treatment have been discussed with the patient and family. After consideration of risks, benefits and other options for treatment, the patient has consented to  Procedure(s): ?RIGHT SUBTALAR AND TALONAVICULAR FUSION (Right) as a surgical intervention.  The patient's history has been reviewed, patient examined, no change in status, stable for surgery.  I have reviewed the patient's chart and labs.  Questions were answered to the patient's satisfaction.   ? ? ?Newt Minion ? ? ?

## 2021-05-15 ENCOUNTER — Encounter (HOSPITAL_COMMUNITY): Payer: Self-pay | Admitting: Orthopedic Surgery

## 2021-05-19 ENCOUNTER — Ambulatory Visit (INDEPENDENT_AMBULATORY_CARE_PROVIDER_SITE_OTHER): Payer: Managed Care, Other (non HMO) | Admitting: Family

## 2021-05-19 ENCOUNTER — Encounter: Payer: Self-pay | Admitting: Family

## 2021-05-19 DIAGNOSIS — M76821 Posterior tibial tendinitis, right leg: Secondary | ICD-10-CM

## 2021-05-19 NOTE — Progress Notes (Signed)
? ?  Post-Op Visit Note ?  ?Patient: [name]           ?Date of Birth: 1965-10-20           ?MRN: [id number] ?Visit Date: 05/19/2021 ?PCP: Haywood Pao, MD ? ?Chief Complaint:  ?Chief Complaint  ?Patient presents with  ? Right Foot - Routine Post Op  ?  05/12/21 right subtalar and talonavicular fusion  ? ? ?HPI:  ?HPI ?The patient is a 56 year old woman who presents today status post right subtalar and talonavicular fusion on May 5 she has been having issues with controlling her pain has been trying not to take her narcotic but her pain has been getting away from her. ?Ortho Exam ?On examination her incisions are well approximated sutures there is no gaping no drainage she does have moderate edema ? ?Visit Diagnoses: No diagnosis found. ? ?Plan: Discussed taking scheduled Tylenol using the oxycodone for breakthrough pain she will use the oxycodone scheduled for the next 1 week then we will start trying to taper off of it she will follow-up in 1 week for suture removal and radiographs of the ankle ? ?Follow-Up Instructions: Return in about 1 week (around 05/26/2021).  ? ?Imaging: ?No results found. ? ?Orders:  ?No orders of the defined types were placed in this encounter. ? ?No orders of the defined types were placed in this encounter. ? ? ? ?PMFS History: ?Patient Active Problem List  ? Diagnosis Date Noted  ? Posterior tibial tendon dysfunction (PTTD) of right lower extremity   ? Chronic venous insufficiency 02/23/2019  ? Varicose veins of both lower extremities 02/23/2019  ? Bilateral leg pain 02/23/2019  ? 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 Cough 02/28/2011  ? ?Past Medical History:  ?Diagnosis Date  ? Allergic rhinitis   ? Anxiety   ? Hypertension   ? Panic attacks   ? Seasonal allergies   ?  ?Family History  ?Problem Relation Age of Onset  ? Hypertension Mother   ? Cancer Mother   ?     skin  ? Hypertension Father   ? Cancer Father   ?     skin  ?  ?Past Surgical History:  ?Procedure Laterality Date  ?  ANTERIOR FUSION CERVICAL SPINE    ? BREAST LUMPECTOMY WITH NEEDLE LOCALIZATION Right 07/30/2012  ? Procedure: RIGHT BREAST WIRE LOCALIZATION LUMPECTOMY ;  Surgeon: Merrie Roof, MD;  Location: [place];  Service: General;  Laterality: Right;  ? BREAST SURGERY    ? CHOLECYSTECTOMY  07/09/2007  ? lap choli  ? DIAGNOSTIC LAPAROSCOPY  01/09/2008  ? ectopic preg rt  ? FOOT ARTHRODESIS Right 05/12/2021  ? Procedure: RIGHT SUBTALAR AND TALONAVICULAR FUSION;  Surgeon: Newt Minion, MD;  Location: [place];  Service: Orthopedics;  Laterality: Right;  ? ?Social History  ? ?Occupational History  ? Not on file  ?Tobacco Use  ? Smoking status: Never  ? Smokeless tobacco: Never  ?Vaping Use  ? Vaping Use: Never used  ?Substance and Sexual Activity  ? Alcohol use: No  ? Drug use: No  ? Sexual activity: Not on file  ? ? ?

## 2021-05-20 MED ORDER — OXYCODONE HCL 5 MG PO CAPS: 5.0000 mg | ORAL_CAPSULE | ORAL | 0 refills | Status: DC | PRN

## 2021-05-22 ENCOUNTER — Encounter (HOSPITAL_COMMUNITY): Payer: Self-pay | Admitting: Orthopedic Surgery

## 2021-05-26 ENCOUNTER — Ambulatory Visit (INDEPENDENT_AMBULATORY_CARE_PROVIDER_SITE_OTHER): Payer: Managed Care, Other (non HMO) | Admitting: Family

## 2021-05-26 ENCOUNTER — Ambulatory Visit (INDEPENDENT_AMBULATORY_CARE_PROVIDER_SITE_OTHER): Payer: Managed Care, Other (non HMO)

## 2021-05-26 ENCOUNTER — Other Ambulatory Visit: Payer: Self-pay | Admitting: Surgical

## 2021-05-26 ENCOUNTER — Telehealth: Payer: Self-pay | Admitting: Family

## 2021-05-26 ENCOUNTER — Encounter: Payer: Self-pay | Admitting: Family

## 2021-05-26 DIAGNOSIS — M76821 Posterior tibial tendinitis, right leg: Secondary | ICD-10-CM

## 2021-05-26 DIAGNOSIS — M25571 Pain in right ankle and joints of right foot: Secondary | ICD-10-CM | POA: Diagnosis not present

## 2021-05-26 DIAGNOSIS — S93401A Sprain of unspecified ligament of right ankle, initial encounter: Secondary | ICD-10-CM

## 2021-05-26 MED ORDER — OXYCODONE HCL 5 MG PO TABS: 5.0000 mg | ORAL_TABLET | ORAL | 0 refills | Status: AC | PRN

## 2021-05-26 MED ORDER — OXYCODONE HCL 5 MG PO CAPS: 5.0000 mg | ORAL_CAPSULE | ORAL | 0 refills | Status: DC | PRN

## 2021-05-26 NOTE — Addendum Note (Signed)
Addended by: Dondra Prader R on: 05/26/2021 11:12 AM   Modules accepted: Orders

## 2021-05-26 NOTE — Telephone Encounter (Signed)
Oxy tablets sent in this evening

## 2021-05-26 NOTE — Telephone Encounter (Signed)
[name] from [facility] @ [address] is calling requesting a medication change for Oxycodone '5mg'$  capsules. They only have that medication in tablet form. They need a new script for tablets. Please advise!

## 2021-05-26 NOTE — Progress Notes (Signed)
Post-Op Visit Note   Patient: [name]           Date of Birth: 1965/08/10           MRN: [id number] Visit Date: 05/26/2021 PCP: Haywood Pao, MD  Chief Complaint:  Chief Complaint  Patient presents with   Right Foot - Routine Post Op    Right sub/tal fusion    HPI:  HPI The patient is a 56 year old woman who presents in postoperative follow-up she is status post subtalar and talonavicular fusion on May 5 she has been using scheduled Tylenol and then oxycodone in between for breakthrough pain.  States this has been working well.  Unfortunately after her last visit she fell walking into the home she states that her right foot and ankle twisted she has had significantly worse swelling since.  Ortho Exam On examination of the right foot and ankle her incisions are well approximated sutures healing well she does have significant edema with some ecchymosis over the dorsum of her foot she is tender over the anterior talofibular ligament she has some mild medial malleolus tenderness as well tender along the lateral column and fifth toe  Visit Diagnoses:  1. Posterior tibial tendon dysfunction (PTTD) of right lower extremity   2. Acute right ankle pain     Plan: Fracture of the fifth toe.  Discussed conservative management.  She will continue with her cam walker continue nonweightbearing we will apply Dynaflex compression wrap for compression today.  Did harvest the sutures over her lateral incision and her plantar heel incision  Follow-Up Instructions: No follow-ups on file.   Imaging: No results found.  Orders:  Orders Placed This Encounter  Procedures   XR Foot Complete Right   XR Ankle 2 Views Right   No orders of the defined types were placed in this encounter.    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   Cough 02/28/2011   Past Medical History:  Diagnosis Date   Allergic rhinitis    Anxiety    Hypertension    Panic attacks    Seasonal allergies     Family History  Problem Relation Age of Onset   Hypertension Mother    Cancer Mother        skin   Hypertension Father    Cancer Father        skin    Past Surgical History:  Procedure Laterality Date   ANTERIOR FUSION CERVICAL SPINE     BREAST LUMPECTOMY WITH NEEDLE LOCALIZATION Right 07/30/2012   Procedure: RIGHT BREAST WIRE LOCALIZATION LUMPECTOMY ;  Surgeon: Merrie Roof, MD;  Location: [place];  Service: General;  Laterality: Right;   BREAST SURGERY     CHOLECYSTECTOMY  07/09/2007   lap choli   DIAGNOSTIC LAPAROSCOPY  01/09/2008   ectopic preg rt   FOOT ARTHRODESIS Right 05/12/2021   Procedure: RIGHT SUBTALAR AND TALONAVICULAR FUSION;  Surgeon: Newt Minion, MD;  Location: [place];  Service: Orthopedics;  Laterality: Right;   Social History   Occupational History   Not on file  Tobacco Use   Smoking status: Never   Smokeless tobacco: Never  Vaping Use   Vaping Use: Never used  Substance  and Sexual Activity   Alcohol use: No   Drug use: No   Sexual activity: Not on file

## 2021-05-26 NOTE — Telephone Encounter (Signed)
[name], this pt was seen this morning by Junie Panning, Oxycodone '5mg'$  was sent in but in capsule form, and they only have tablets. Need new script, will you be able to send in as Erin and Dr. Sharol Given are out of the office?  Pt is s/p right subtalar and talonavicular fusion 05/12/21 and also has a new fracture of 5th toe due to a fall.

## 2021-06-02 ENCOUNTER — Encounter: Payer: Self-pay | Admitting: Family

## 2021-06-02 ENCOUNTER — Ambulatory Visit (INDEPENDENT_AMBULATORY_CARE_PROVIDER_SITE_OTHER): Payer: Managed Care, Other (non HMO) | Admitting: Family

## 2021-06-02 DIAGNOSIS — M76821 Posterior tibial tendinitis, right leg: Secondary | ICD-10-CM

## 2021-06-02 DIAGNOSIS — M25571 Pain in right ankle and joints of right foot: Secondary | ICD-10-CM

## 2021-06-02 NOTE — Progress Notes (Signed)
   Post-Op Visit Note   Patient: [name]           Date of Birth: March 11, 1965           MRN: [id number] Visit Date: 06/02/2021 PCP: Haywood Pao, MD  Chief Complaint:  Chief Complaint  Patient presents with   Right Foot - Routine Post Op    05/12/2021 right subtalar and talonavicular fusion     HPI:  HPI The patient is a 56 year old woman who presents 3 weeks status post right subtalar and talonavicular fusion she also had a fall a little over a week ago and sprained the same foot and ankle.  She has been nonweightbearing and in a Dynaflex compression wrap since last visit Ortho Exam Incisions are all well-healed the dorsal foot is incision continues have sutures these were harvested today without incident she continues to have some edema in her foot.  No erythema no ulceration  Visit Diagnoses: No diagnosis found.  Plan: Placed in a medical compression stocking today she may shower may get incisions wet pat dry continue compression elevation nonweightbearing cam walker follow-up in the office in 10 days.  With repeat radiographs of the foot  Follow-Up Instructions: No follow-ups on file.   Imaging: No results found.  Orders:  No orders of the defined types were placed in this encounter.  No orders of the defined types were placed in this encounter.    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   Cough 02/28/2011   Past Medical History:  Diagnosis Date   Allergic rhinitis    Anxiety    Hypertension    Panic attacks    Seasonal allergies     Family History  Problem Relation Age of Onset   Hypertension Mother    Cancer Mother        skin   Hypertension Father    Cancer Father        skin    Past  Surgical History:  Procedure Laterality Date   ANTERIOR FUSION CERVICAL SPINE     BREAST LUMPECTOMY WITH NEEDLE LOCALIZATION Right 07/30/2012   Procedure: RIGHT BREAST WIRE LOCALIZATION LUMPECTOMY ;  Surgeon: Merrie Roof, MD;  Location: [place];  Service: General;  Laterality: Right;   BREAST SURGERY     CHOLECYSTECTOMY  07/09/2007   lap choli   DIAGNOSTIC LAPAROSCOPY  01/09/2008   ectopic preg rt   FOOT ARTHRODESIS Right 05/12/2021   Procedure: RIGHT SUBTALAR AND TALONAVICULAR FUSION;  Surgeon: Newt Minion, MD;  Location: [place];  Service: Orthopedics;  Laterality: Right;   Social History   Occupational History   Not on file  Tobacco Use   Smoking status: Never   Smokeless tobacco: Never  Vaping Use   Vaping Use: Never used  Substance and Sexual Activity   Alcohol use: No   Drug use: No   Sexual activity: Not on file

## 2021-06-09 ENCOUNTER — Ambulatory Visit (INDEPENDENT_AMBULATORY_CARE_PROVIDER_SITE_OTHER): Payer: Managed Care, Other (non HMO) | Admitting: Family

## 2021-06-09 ENCOUNTER — Encounter: Payer: Self-pay | Admitting: Family

## 2021-06-09 ENCOUNTER — Ambulatory Visit (INDEPENDENT_AMBULATORY_CARE_PROVIDER_SITE_OTHER): Payer: Managed Care, Other (non HMO)

## 2021-06-09 DIAGNOSIS — M79671 Pain in right foot: Secondary | ICD-10-CM

## 2021-06-09 NOTE — Progress Notes (Signed)
   Post-Op Visit Note   Patient: [name]           Date of Birth: [birth year]           MRN: [id number] Visit Date: 06/09/2021 PCP: Haywood Pao, MD  Chief Complaint:  Chief Complaint  Patient presents with   Right Foot - Routine Post Op    05/12/2021 right subtalar talonavicular joint     HPI:  HPI The patient is a 56 year old woman who presents today status post right subtalar and talonavicular fusion she continues to complain of pain because of the swelling that she has been having in her foot and ankle she has been wearing compression stockings.  She has not been wearing her cam walker and does complain of some stiffness she is developing an equinus contracture Ortho Exam On examination of the right foot and ankle she does have pitting edema her incisions are well-healed there is no erythema no drainage no open area she has developed an equinus contracture.  Dorsiflexion shy of neutral  Visit Diagnoses:  1. Pain in right foot     Plan: Discussed beginning heel cord stretching.  Wearing her cam walker she will hold off on weightbearing for 2 more weeks.  Advised scar massage.  Continue compression.  Follow-Up Instructions: No follow-ups on file.   Imaging: No results found.  Orders:  Orders Placed This Encounter  Procedures   XR Foot Complete Right   No orders of the defined types were placed in this encounter.    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   Cough 02/28/2011   Past Medical History:  Diagnosis Date   Allergic rhinitis    Anxiety    Hypertension    Panic attacks    Seasonal allergies     Family History  Problem Relation Age of Onset   Hypertension Mother    Cancer Mother         skin   Hypertension Father    Cancer Father        skin    Past Surgical History:  Procedure Laterality Date   ANTERIOR FUSION CERVICAL SPINE     BREAST LUMPECTOMY WITH NEEDLE LOCALIZATION Right 07/30/2012   Procedure: RIGHT BREAST WIRE LOCALIZATION LUMPECTOMY ;  Surgeon: Merrie Roof, MD;  Location: [facility];  Service: General;  Laterality: Right;   BREAST SURGERY     CHOLECYSTECTOMY  07/09/2007   lap choli   DIAGNOSTIC LAPAROSCOPY  01/09/2008   ectopic preg rt   FOOT ARTHRODESIS Right 05/12/2021   Procedure: RIGHT SUBTALAR AND TALONAVICULAR FUSION;  Surgeon: Newt Minion, MD;  Location: [facility];  Service: Orthopedics;  Laterality: Right;   Social History   Occupational History   Not on file  Tobacco Use   Smoking status: Never   Smokeless tobacco: Never  Vaping Use   Vaping Use: Never used  Substance and Sexual Activity   Alcohol use: No   Drug use: No   Sexual activity: Not on file

## 2021-06-27 ENCOUNTER — Ambulatory Visit (INDEPENDENT_AMBULATORY_CARE_PROVIDER_SITE_OTHER): Payer: Managed Care, Other (non HMO) | Admitting: Family

## 2021-06-27 DIAGNOSIS — M76821 Posterior tibial tendinitis, right leg: Secondary | ICD-10-CM

## 2021-06-27 NOTE — Progress Notes (Unsigned)
Post-Op Visit Note   Patient: [name]           Date of Birth: [birth year]           MRN: [id number] Visit Date: 06/27/2021 PCP: Haywood Pao, MD  Chief Complaint:  Chief Complaint  Patient presents with   Right Foot - Routine Post Op    05/12/21 right sub/tal joint fusion    HPI:  HPI The patient is a 56 year old woman who presents status post right subtalar and talonavicular fusion.  Continues to complain of significant swelling despite elevating above her head around-the-clock.  Associated numbness and tingling in foot. She states she has not yet attempted weightbearing.  She states she is unable to wear her cam walker due to swelling and pain from the boot.  She has not worn it at all since surgery  Ortho Exam On examination of the right foot she does have significant pitting edema to the from the toes to the ankle.  Her incisions are well-healed there is no erythema no open area.  No hypersensitivity to light touch  She has developed an equinus contracture  Visit Diagnoses: No diagnosis found.  Plan: Encouraged her to begin weightbearing as tolerated in her cam walker.  Attempted to do so in the room this was quite painful for her.  We will send her for physical therapy to begin working on range of motion and is specifically dorsiflexion.  Will also be weightbearing as tolerated in her cam walker.  She would like to go to physical therapy at [facility] and from the center.  Order provided.  Follow-Up Instructions: No follow-ups on file.   Imaging: No results found.  Orders:  No orders of the defined types were placed in this encounter.  No orders of the defined types were placed in this encounter.    PMFS History: Patient Active Problem List   Diagnosis Date Noted   Posterior tibial tendon dysfunction (PTTD) of right lower extremity    Chronic venous insufficiency 02/23/2019   Varicose veins of both lower extremities 02/23/2019   Bilateral leg pain  02/23/2019   Papilloma of breast 07/07/2012   Cough 02/28/2011   Past Medical History:  Diagnosis Date   Allergic rhinitis    Anxiety    Hypertension    Panic attacks    Seasonal allergies     Family History  Problem Relation Age of Onset   Hypertension Mother    Cancer Mother        skin   Hypertension Father    Cancer Father        skin    Past Surgical History:  Procedure Laterality Date   ANTERIOR FUSION CERVICAL SPINE     BREAST LUMPECTOMY WITH NEEDLE LOCALIZATION Right 07/30/2012   Procedure: RIGHT BREAST WIRE LOCALIZATION LUMPECTOMY ;  Surgeon: Merrie Roof, MD;  Location: [facility];  Service: General;  Laterality: Right;   BREAST SURGERY     CHOLECYSTECTOMY  07/09/2007   lap choli   DIAGNOSTIC LAPAROSCOPY  01/09/2008   ectopic preg rt   FOOT ARTHRODESIS Right 05/12/2021   Procedure: RIGHT SUBTALAR AND TALONAVICULAR FUSION;  Surgeon: Newt Minion, MD;  Location: [facility];  Service: Orthopedics;  Laterality: Right;   Social History   Occupational History   Not on file  Tobacco Use   Smoking status: Never   Smokeless tobacco: Never  Vaping Use   Vaping Use: Never used  Substance and Sexual Activity  Alcohol use: No   Drug use: No   Sexual activity: Not on file

## 2021-06-28 ENCOUNTER — Telehealth: Payer: Self-pay | Admitting: Family

## 2021-06-28 ENCOUNTER — Encounter: Payer: Self-pay | Admitting: Family

## 2021-06-28 NOTE — Telephone Encounter (Signed)
Out of work for next 4 weeks, will reevaluate on 7/18

## 2021-06-28 NOTE — Telephone Encounter (Signed)
Patient seen 6/20, please advise work status. [employer] [place] [employer] forms received.

## 2021-06-28 NOTE — Telephone Encounter (Signed)
Pt was in the office yesterday please advise of work status for [employer]. Thanks

## 2021-07-19 ENCOUNTER — Telehealth: Payer: Self-pay | Admitting: Family

## 2021-07-19 NOTE — Telephone Encounter (Signed)
Received reasonable Accommodation form from patient     Forwarding to [place] today

## 2021-07-25 ENCOUNTER — Encounter: Payer: Self-pay | Admitting: Family

## 2021-07-25 ENCOUNTER — Ambulatory Visit (INDEPENDENT_AMBULATORY_CARE_PROVIDER_SITE_OTHER): Payer: Managed Care, Other (non HMO) | Admitting: Family

## 2021-07-25 DIAGNOSIS — M76821 Posterior tibial tendinitis, right leg: Secondary | ICD-10-CM

## 2021-07-25 NOTE — Progress Notes (Signed)
   Post-Op Visit Note   Patient: [name]           Date of Birth: 1965/02/29           MRN: [id number] Visit Date: 07/25/2021 PCP: Haywood Pao, MD  Chief Complaint:  Chief Complaint  Patient presents with   Right Foot - Routine Post Op    05/12/21 right sub/tal joint fusion    HPI:  HPI The patient is a 56 year old woman who is status post right subtalar and talonavicular fusion May 5 she has been working with physical therapy and is quite pleased with her progress she is weightbearing with her cam walker as well as a rolling walker.  She is pleased with her progress feels her heel is slowly desensitizing however she is not yet ready to return to work Ortho Exam On examination of the right foot incisions are well-healed she does have trace edema no erythema no warmth  Visit Diagnoses: No diagnosis found.  Plan: Encouraged her to begin wearing regular shoewear she states she has some new balance walking shoes which she plans to use when she returns to work she will continue with physical therapy continue compression garments  Discussed possibility of returning to work in 4 weeks with [employer] work hours  Follow-Up Instructions: No follow-ups on file.   Imaging: No results found.  Orders:  No orders of the defined types were placed in this encounter.  No orders of the defined types were placed in this encounter.    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   Cough 02/28/2011   Past Medical History:  Diagnosis Date   Allergic rhinitis    Anxiety    Hypertension    Panic attacks    Seasonal allergies     Family History  Problem Relation Age of Onset   Hypertension Mother    Cancer Mother        skin    Hypertension Father    Cancer Father        skin    Past Surgical History:  Procedure Laterality Date   ANTERIOR FUSION CERVICAL SPINE     BREAST LUMPECTOMY WITH NEEDLE LOCALIZATION Right 07/30/2012   Procedure: RIGHT BREAST WIRE LOCALIZATION LUMPECTOMY ;  Surgeon: Merrie Roof, MD;  Location: [facility];  Service: General;  Laterality: Right;   BREAST SURGERY     CHOLECYSTECTOMY  07/09/2007   lap choli   DIAGNOSTIC LAPAROSCOPY  01/09/2008   ectopic preg rt   FOOT ARTHRODESIS Right 05/12/2021   Procedure: RIGHT SUBTALAR AND TALONAVICULAR FUSION;  Surgeon: Newt Minion, MD;  Location: [facility];  Service: Orthopedics;  Laterality: Right;   Social History   Occupational History   Not on file  Tobacco Use   Smoking status: Never   Smokeless tobacco: Never  Vaping Use   Vaping Use: Never used  Substance and Sexual Activity   Alcohol use: No   Drug use: No   Sexual activity: Not on file

## 2021-08-15 ENCOUNTER — Telehealth: Payer: Self-pay | Admitting: Orthopedic Surgery

## 2021-08-15 NOTE — Telephone Encounter (Signed)
Called patient left message to return call to reschedule her appointment with Dr. Sharol Given sooner than later per Junie Panning

## 2021-08-24 ENCOUNTER — Ambulatory Visit: Payer: Managed Care, Other (non HMO) | Admitting: Orthopedic Surgery

## 2021-08-24 ENCOUNTER — Telehealth: Payer: Self-pay | Admitting: Orthopedic Surgery

## 2021-08-24 ENCOUNTER — Ambulatory Visit: Payer: Self-pay

## 2021-08-24 DIAGNOSIS — M76821 Posterior tibial tendinitis, right leg: Secondary | ICD-10-CM

## 2021-08-24 NOTE — Telephone Encounter (Signed)
Pt asked for copy of xrays. Call when ready. Phone number is [phone]. Paper back copy

## 2021-08-25 ENCOUNTER — Ambulatory Visit: Payer: Managed Care, Other (non HMO) | Admitting: Family

## 2021-08-25 NOTE — Telephone Encounter (Signed)
Patient aware copy of x-rays is ready for pickup at front desk. She will pickup at next appointment.

## 2021-08-29 ENCOUNTER — Encounter: Payer: Self-pay | Admitting: Orthopedic Surgery

## 2021-08-29 NOTE — Progress Notes (Signed)
Office Visit Note   Patient: [name]           Date of Birth: 10-03-1965           MRN: [id number] Visit Date: 08/24/2021              Requested by: Haywood Pao, MD [address] [place],  [place] [address] PCP: Haywood Pao, MD  Chief Complaint  Patient presents with   Right Ankle - Follow-up    05/12/2021 right subtalar talonavicular fusion    Right Foot - Follow-up      HPI: Patient is a 56 year old woman who is over 3 months status post right foot subtalar and talonavicular fusion.  Patient states she is still wearing the fracture boot she is currently in regular sneakers patient is continuing with physical therapy and compression.  She states she still has some pain across the midfoot.  Assessment & Plan: Visit Diagnoses:  1. Posterior tibial tendon dysfunction (PTTD) of right lower extremity     Plan: Continue physical therapy work on range of motion of her toes and ankle.  Continue out of work for 4 weeks.  Follow-up evaluate for return to work with advancing activities and hours as tolerated.  Follow-Up Instructions: Return in about 4 weeks (around 09/21/2021).   Ortho Exam  Patient is alert, oriented, no adenopathy, well-dressed, normal affect, normal respiratory effort. Examination the incisions are well-healed she has good range of motion of the ankle no redness no cellulitis no signs of infection.  Imaging: No results found. No images are attached to the encounter.  Labs: Lab Results  Component Value Date   HGBA1C  02/15/2009    5.8 (NOTE) The ADA recommends the following therapeutic goal for glycemic control related to Hgb A1c measurement: Goal of therapy: <6.5 Hgb A1c  Reference: American Diabetes Association: Clinical Practice Recommendations 2010, Diabetes Care, 2010, 33: (Suppl  1).     Lab Results  Component Value Date   ALBUMIN 3.0 (L) 07/13/2007   ALBUMIN 3.2 (L) 07/12/2007   ALBUMIN 3.4 (L) 07/11/2007    No results  found for: "MG" No results found for: "VD25OH"  No results found for: "PREALBUMIN"    Latest Ref Rng & Units 05/12/2021    8:13 AM 07/30/2012    9:07 AM 02/14/2009    9:12 PM  CBC EXTENDED  WBC 4.0 - 10.5 K/uL 6.1   6.9   RBC 3.87 - 5.11 MIL/uL 4.88   4.48   Hemoglobin 12.0 - 15.0 g/dL 14.4  13.4  12.8   HCT 36.0 - 46.0 % 42.8   37.3   Platelets 150 - 400 K/uL 274   278   NEUT# 1.7 - 7.7 K/uL   3.9   Lymph# 0.7 - 4.0 K/uL   2.5      There is no height or weight on file to calculate BMI.  Orders:  Orders Placed This Encounter  Procedures   XR Foot Complete Right   No orders of the defined types were placed in this encounter.    Procedures: No procedures performed  Clinical Data: No additional findings.  ROS:  All other systems negative, except as noted in the HPI. Review of Systems  Objective: Vital Signs: There were no vitals taken for this visit.  Specialty Comments:  No specialty comments available.  PMFS History: Patient Active Problem List   Diagnosis Date Noted   Posterior tibial tendon dysfunction (PTTD) of right lower extremity  Chronic venous insufficiency 02/23/2019   Varicose veins of both lower extremities 02/23/2019   Bilateral leg pain 02/23/2019   Papilloma of breast 07/07/2012   Cough 02/28/2011   Past Medical History:  Diagnosis Date   Allergic rhinitis    Anxiety    Hypertension    Panic attacks    Seasonal allergies     Family History  Problem Relation Age of Onset   Hypertension Mother    Cancer Mother        skin   Hypertension Father    Cancer Father        skin    Past Surgical History:  Procedure Laterality Date   ANTERIOR FUSION CERVICAL SPINE     BREAST LUMPECTOMY WITH NEEDLE LOCALIZATION Right 07/30/2012   Procedure: RIGHT BREAST WIRE LOCALIZATION LUMPECTOMY ;  Surgeon: Merrie Roof, MD;  Location: [facility];  Service: General;  Laterality: Right;   BREAST SURGERY     CHOLECYSTECTOMY  07/09/2007    lap choli   DIAGNOSTIC LAPAROSCOPY  01/09/2008   ectopic preg rt   FOOT ARTHRODESIS Right 05/12/2021   Procedure: RIGHT SUBTALAR AND TALONAVICULAR FUSION;  Surgeon: Newt Minion, MD;  Location: [facility];  Service: Orthopedics;  Laterality: Right;   Social History   Occupational History   Not on file  Tobacco Use   Smoking status: Never   Smokeless tobacco: Never  Vaping Use   Vaping Use: Never used  Substance and Sexual Activity   Alcohol use: No   Drug use: No   Sexual activity: Not on file

## 2021-09-21 ENCOUNTER — Encounter: Payer: Self-pay | Admitting: Orthopedic Surgery

## 2021-09-21 ENCOUNTER — Ambulatory Visit (INDEPENDENT_AMBULATORY_CARE_PROVIDER_SITE_OTHER): Payer: Managed Care, Other (non HMO) | Admitting: Orthopedic Surgery

## 2021-09-21 DIAGNOSIS — M76821 Posterior tibial tendinitis, right leg: Secondary | ICD-10-CM | POA: Diagnosis not present

## 2021-09-21 NOTE — Progress Notes (Signed)
Office Visit Note   Patient: Cathrine Krizan           Date of Birth: 05-27-1965           MRN: [id number] Visit Date: 09/21/2021              Requested by: Haywood Pao, MD [address] [place],  [place] [address] PCP: Haywood Pao, MD  Chief Complaint  Patient presents with   Right Foot - Follow-up    05/12/2021 right subtalar and talonavicular fusion       HPI: Patient is a 56 year old woman who is 4 months status post right subtalar and talonavicular fusion.  With ambulation patient feels like the bones are breaking across the metatarsal heads.  She is currently ambulating in regular shoewear states she still has some swelling.  She is going to physical therapy.  Wearing a compression sock.  Assessment & Plan: Visit Diagnoses:  1. Posterior tibial tendon dysfunction (PTTD) of right lower extremity     Plan: Continue with the compression sock.  We will add an ASO  Repeat three-view radiographs of the right ankle at follow-up.  Patient is given a note to stay out of work for 4 weeks from now.  Possible return to work at follow-up.  Follow-Up Instructions: Return in about 4 weeks (around 10/19/2021).   Ortho Exam  Patient is alert, oriented, no adenopathy, well-dressed, normal affect, normal respiratory effort. Examination patient does have Achilles tightness with dorsiflexion to neutral with dorsiflexion she does have impingement anteriorly over the ankle and this is overloading the forefoot which is causing her forefoot pain.  She does have generalized venous swelling but no ulcers no drainage no cellulitis the talonavicular joint is not tender to palpation.    Imaging: No results found. No images are attached to the encounter.  Labs: Lab Results  Component Value Date   HGBA1C  02/15/2009    5.8 (NOTE) The ADA recommends the following therapeutic goal for glycemic control related to Hgb A1c measurement: Goal of therapy: <6.5 Hgb A1c  Reference: American  Diabetes Association: Clinical Practice Recommendations 2010, Diabetes Care, 2010, 33: (Suppl  1).     Lab Results  Component Value Date   ALBUMIN 3.0 (L) 07/13/2007   ALBUMIN 3.2 (L) 07/12/2007   ALBUMIN 3.4 (L) 07/11/2007    No results found for: "MG" No results found for: "VD25OH"  No results found for: "PREALBUMIN"    Latest Ref Rng & Units 05/12/2021    8:13 AM 07/30/2012    9:07 AM 02/14/2009    9:12 PM  CBC EXTENDED  WBC 4.0 - 10.5 K/uL 6.1   6.9   RBC 3.87 - 5.11 MIL/uL 4.88   4.48   Hemoglobin 12.0 - 15.0 g/dL 14.4  13.4  12.8   HCT 36.0 - 46.0 % 42.8   37.3   Platelets 150 - 400 K/uL 274   278   NEUT# 1.7 - 7.7 K/uL   3.9   Lymph# 0.7 - 4.0 K/uL   2.5      There is no height or weight on file to calculate BMI.  Orders:  No orders of the defined types were placed in this encounter.  No orders of the defined types were placed in this encounter.    Procedures: No procedures performed  Clinical Data: No additional findings.  ROS:  All other systems negative, except as noted in the HPI. Review of Systems  Objective: Vital Signs: There were no  vitals taken for this visit.  Specialty Comments:  No specialty comments available.  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   Cough 02/28/2011   Past Medical History:  Diagnosis Date   Allergic rhinitis    Anxiety    Hypertension    Panic attacks    Seasonal allergies     Family History  Problem Relation Age of Onset   Hypertension Mother    Cancer Mother        skin   Hypertension Father    Cancer Father        skin    Past Surgical History:  Procedure Laterality Date   ANTERIOR FUSION CERVICAL SPINE     BREAST LUMPECTOMY WITH NEEDLE  LOCALIZATION Right 07/30/2012   Procedure: RIGHT BREAST WIRE LOCALIZATION LUMPECTOMY ;  Surgeon: Merrie Roof, MD;  Location: [facility];  Service: General;  Laterality: Right;   BREAST SURGERY     CHOLECYSTECTOMY  07/09/2007   lap choli   DIAGNOSTIC LAPAROSCOPY  01/09/2008   ectopic preg rt   FOOT ARTHRODESIS Right 05/12/2021   Procedure: RIGHT SUBTALAR AND TALONAVICULAR FUSION;  Surgeon: Newt Minion, MD;  Location: [facility];  Service: Orthopedics;  Laterality: Right;   Social History   Occupational History   Not on file  Tobacco Use   Smoking status: Never   Smokeless tobacco: Never  Vaping Use   Vaping Use: Never used  Substance and Sexual Activity   Alcohol use: No   Drug use: No   Sexual activity: Not on file

## 2021-10-03 ENCOUNTER — Encounter: Payer: Self-pay | Admitting: Orthopedic Surgery

## 2021-10-19 ENCOUNTER — Ambulatory Visit (INDEPENDENT_AMBULATORY_CARE_PROVIDER_SITE_OTHER): Payer: Managed Care, Other (non HMO)

## 2021-10-19 ENCOUNTER — Ambulatory Visit: Payer: Managed Care, Other (non HMO) | Admitting: Orthopedic Surgery

## 2021-10-19 DIAGNOSIS — M79671 Pain in right foot: Secondary | ICD-10-CM | POA: Diagnosis not present

## 2021-10-19 DIAGNOSIS — M76821 Posterior tibial tendinitis, right leg: Secondary | ICD-10-CM

## 2021-10-19 DIAGNOSIS — Z981 Arthrodesis status: Secondary | ICD-10-CM

## 2021-10-20 ENCOUNTER — Telehealth: Payer: Self-pay | Admitting: Orthopedic Surgery

## 2021-10-20 ENCOUNTER — Encounter: Payer: Self-pay | Admitting: Orthopedic Surgery

## 2021-10-20 NOTE — Telephone Encounter (Signed)
[employer] forms received. To [employer]

## 2021-10-20 NOTE — Progress Notes (Signed)
Office Visit Note   Patient: [name]           Date of Birth: [birth year]           MRN: [id number] Visit Date: 10/19/2021              Requested by: Haywood Pao, MD [address] [place],  [place] [address] PCP: Haywood Pao, MD  Chief Complaint  Patient presents with   Right Ankle - Follow-up    05/12/2021 right subtalar and talonavicular fusion      HPI: Patient is a 56 year old woman who is 5 months status post right subtalar and talonavicular fusion.  She is currently wearing compression socks and an ASO.  Patient states she still has difficulty with ADLs with pain across her midfoot.  Assessment & Plan: Visit Diagnoses:  1. S/P ankle fusion   2. Posterior tibial tendon dysfunction (PTTD) of right lower extremity   3. Pain in right foot     Plan: Patient was given instructions and demonstrated fascial strengthening and isometric strengthening.  Recommended a carbon plate to unload her midfoot and wean out of the ASO.  Follow-Up Instructions: Return in about 4 weeks (around 11/16/2021).   Ortho Exam  Patient is alert, oriented, no adenopathy, well-dressed, normal affect, normal respiratory effort. Examination patient's fusion is stable she does have pain to palpation across the midfoot.  Her foot is plantigrade there is no redness no cellulitis no swelling.  Imaging: No results found. No images are attached to the encounter.  Labs: Lab Results  Component Value Date   HGBA1C  02/15/2009    5.8 (NOTE) The ADA recommends the following therapeutic goal for glycemic control related to Hgb A1c measurement: Goal of therapy: <6.5 Hgb A1c  Reference: American Diabetes Association: Clinical Practice Recommendations 2010, Diabetes Care, 2010, 33: (Suppl  1).     Lab Results  Component Value Date   ALBUMIN 3.0 (L) 07/13/2007   ALBUMIN 3.2 (L) 07/12/2007   ALBUMIN 3.4 (L) 07/11/2007    No results found for: "MG" No results found for: "VD25OH"  No  results found for: "PREALBUMIN"    Latest Ref Rng & Units 05/12/2021    8:13 AM 07/30/2012    9:07 AM 02/14/2009    9:12 PM  CBC EXTENDED  WBC 4.0 - 10.5 K/uL 6.1   6.9   RBC 3.87 - 5.11 MIL/uL 4.88   4.48   Hemoglobin 12.0 - 15.0 g/dL 14.4  13.4  12.8   HCT 36.0 - 46.0 % 42.8   37.3   Platelets 150 - 400 K/uL 274   278   NEUT# 1.7 - 7.7 K/uL   3.9   Lymph# 0.7 - 4.0 K/uL   2.5      There is no height or weight on file to calculate BMI.  Orders:  Orders Placed This Encounter  Procedures   XR Ankle Complete Right   No orders of the defined types were placed in this encounter.    Procedures: No procedures performed  Clinical Data: No additional findings.  ROS:  All other systems negative, except as noted in the HPI. Review of Systems  Objective: Vital Signs: There were no vitals taken for this visit.  Specialty Comments:  No specialty comments available.  PMFS History: Patient Active Problem List   Diagnosis Date Noted   Posterior tibial tendon dysfunction (PTTD) of right lower extremity    Chronic venous insufficiency 02/23/2019   Varicose veins of both  lower extremities 02/23/2019   Bilateral leg pain 02/23/2019   Papilloma of breast 07/07/2012   Cough 02/28/2011   Past Medical History:  Diagnosis Date   Allergic rhinitis    Anxiety    Hypertension    Panic attacks    Seasonal allergies     Family History  Problem Relation Age of Onset   Hypertension Mother    Cancer Mother        skin   Hypertension Father    Cancer Father        skin    Past Surgical History:  Procedure Laterality Date   ANTERIOR FUSION CERVICAL SPINE     BREAST LUMPECTOMY WITH NEEDLE LOCALIZATION Right 07/30/2012   Procedure: RIGHT BREAST WIRE LOCALIZATION LUMPECTOMY ;  Surgeon: Merrie Roof, MD;  Location: [facility];  Service: General;  Laterality: Right;   BREAST SURGERY     CHOLECYSTECTOMY  07/09/2007   lap choli   DIAGNOSTIC LAPAROSCOPY  01/09/2008    ectopic preg rt   FOOT ARTHRODESIS Right 05/12/2021   Procedure: RIGHT SUBTALAR AND TALONAVICULAR FUSION;  Surgeon: Newt Minion, MD;  Location: [facility];  Service: Orthopedics;  Laterality: Right;   Social History   Occupational History   Not on file  Tobacco Use   Smoking status: Never   Smokeless tobacco: Never  Vaping Use   Vaping Use: Never used  Substance and Sexual Activity   Alcohol use: No   Drug use: No   Sexual activity: Not on file

## 2021-11-01 ENCOUNTER — Telehealth: Payer: Self-pay | Admitting: Orthopedic Surgery

## 2021-11-01 NOTE — Telephone Encounter (Signed)
I am assuming there is nothing documented in the chart. She has a follow up appt on 11/16/2021

## 2021-11-01 NOTE — Telephone Encounter (Signed)
Disability forms received. Please advise if patient is still oow. Thank you

## 2021-11-02 NOTE — Telephone Encounter (Signed)
[employer] advised to hold forms til follow up appt

## 2021-11-13 ENCOUNTER — Encounter: Payer: Self-pay | Admitting: Orthopedic Surgery

## 2021-11-16 ENCOUNTER — Ambulatory Visit (INDEPENDENT_AMBULATORY_CARE_PROVIDER_SITE_OTHER): Payer: Managed Care, Other (non HMO) | Admitting: Orthopedic Surgery

## 2021-11-16 DIAGNOSIS — M76821 Posterior tibial tendinitis, right leg: Secondary | ICD-10-CM | POA: Diagnosis not present

## 2021-11-22 ENCOUNTER — Encounter: Payer: Self-pay | Admitting: Orthopedic Surgery

## 2021-11-27 ENCOUNTER — Encounter: Payer: Self-pay | Admitting: Orthopedic Surgery

## 2021-11-27 NOTE — Progress Notes (Signed)
Office Visit Note   Patient: [name]           Date of Birth: 1965/11/21           MRN: [id number] Visit Date: 11/16/2021              Requested by: Haywood Pao, MD [address] [place],  [place] [address] PCP: Haywood Pao, MD  Chief Complaint  Patient presents with   Right Foot - Follow-up    05/12/2021 right subtalar and talonavicular fusion      HPI: Patient is a 56 year old woman who is 6 months status post subtalar and talonavicular fusion for posterior tibial tendon insufficiency.  Patient has been working on [employer].  Had recommended a carbon plate and weaning off the ankle stabilizing orthosis.  She is currently wearing compression stockings she states that she is slowly feeling better.  Assessment & Plan: Visit Diagnoses:  1. Posterior tibial tendon dysfunction (PTTD) of right lower extremity     Plan: Recommend that she could use a Tuli heel cup to decrease pressure over the calcaneus.  She is given a note to continue out of work for 4 weeks.  She will continue with her exercise strengthening compression and elevation.  Follow-Up Instructions: Return in about 4 weeks (around 12/14/2021).   Ortho Exam  Patient is alert, oriented, no adenopathy, well-dressed, normal affect, normal respiratory effort. Examination patient has good ankle range of motion the surgical incisions are well-healed she has decreased swelling she has improved function of her foot.  There is no signs of infection.  Imaging: No results found. No images are attached to the encounter.  Labs: Lab Results  Component Value Date   HGBA1C  02/15/2009    5.8 (NOTE) The ADA recommends the following therapeutic goal for glycemic control related to Hgb A1c measurement: Goal of therapy: <6.5 Hgb A1c  Reference: American Diabetes Association: Clinical Practice Recommendations 2010, Diabetes Care, 2010, 33: (Suppl  1).     Lab Results  Component Value  Date   ALBUMIN 3.0 (L) 07/13/2007   ALBUMIN 3.2 (L) 07/12/2007   ALBUMIN 3.4 (L) 07/11/2007    No results found for: "MG" No results found for: "VD25OH"  No results found for: "PREALBUMIN"    Latest Ref Rng & Units 05/12/2021    8:13 AM 07/30/2012    9:07 AM 02/14/2009    9:12 PM  CBC EXTENDED  WBC 4.0 - 10.5 K/uL 6.1   6.9   RBC 3.87 - 5.11 MIL/uL 4.88   4.48   Hemoglobin 12.0 - 15.0 g/dL 14.4  13.4  12.8   HCT 36.0 - 46.0 % 42.8   37.3   Platelets 150 - 400 K/uL 274   278   NEUT# 1.7 - 7.7 K/uL   3.9   Lymph# 0.7 - 4.0 K/uL   2.5      There is no height or weight on file to calculate BMI.  Orders:  No orders of the defined types were placed in this encounter.  No orders of the defined types were placed in this encounter.    Procedures: No procedures performed  Clinical Data: No additional findings.  ROS:  All other systems negative, except as noted in the HPI. Review of Systems  Objective: Vital Signs: There were no vitals taken for this visit.  Specialty Comments:  No specialty comments available.  PMFS History: Patient Active Problem List   Diagnosis Date Noted   Posterior  tibial tendon dysfunction (PTTD) of right lower extremity    Chronic venous insufficiency 02/23/2019   Varicose veins of both lower extremities 02/23/2019   Bilateral leg pain 02/23/2019   Papilloma of breast 07/07/2012   Cough 02/28/2011   Past Medical History:  Diagnosis Date   Allergic rhinitis    Anxiety    Hypertension    Panic attacks    Seasonal allergies     Family History  Problem Relation Age of Onset   Hypertension Mother    Cancer Mother        skin   Hypertension Father    Cancer Father        skin    Past Surgical History:  Procedure Laterality Date   ANTERIOR FUSION CERVICAL SPINE     BREAST LUMPECTOMY WITH NEEDLE LOCALIZATION Right 07/30/2012   Procedure: RIGHT BREAST WIRE LOCALIZATION LUMPECTOMY ;  Surgeon: Merrie Roof, MD;  Location: [facility];  Service: General;  Laterality: Right;   BREAST SURGERY     CHOLECYSTECTOMY  07/09/2007   lap choli   DIAGNOSTIC LAPAROSCOPY  01/09/2008   ectopic preg rt   FOOT ARTHRODESIS Right 05/12/2021   Procedure: RIGHT SUBTALAR AND TALONAVICULAR FUSION;  Surgeon: Newt Minion, MD;  Location: [facility];  Service: Orthopedics;  Laterality: Right;   Social History   Occupational History   Not on file  Tobacco Use   Smoking status: Never   Smokeless tobacco: Never  Vaping Use   Vaping Use: Never used  Substance and Sexual Activity   Alcohol use: No   Drug use: No   Sexual activity: Not on file

## 2021-12-14 ENCOUNTER — Encounter: Payer: Self-pay | Admitting: Orthopedic Surgery

## 2021-12-14 ENCOUNTER — Ambulatory Visit (INDEPENDENT_AMBULATORY_CARE_PROVIDER_SITE_OTHER): Payer: Managed Care, Other (non HMO) | Admitting: Orthopedic Surgery

## 2021-12-14 DIAGNOSIS — M76821 Posterior tibial tendinitis, right leg: Secondary | ICD-10-CM | POA: Diagnosis not present

## 2021-12-14 NOTE — Progress Notes (Signed)
Office Visit Note   Patient: [name]           Date of Birth: 12/12/1965           MRN: [id number] Visit Date: 12/14/2021              Requested by: Haywood Pao, MD [address] [place],   [id number] PCP: Haywood Pao, MD  Chief Complaint  Patient presents with   Right Foot - Follow-up    05/12/2021 right subtalar and talonavicular fusion      HPI: Patient is a 56 year old woman who is seen in follow-up status post subtalar and talonavicular fusion for posterior tibial tendon insufficiency.  Patient states that using the Tuli heel cup as well as the carbon footplate has significantly improved her symptoms.  Assessment & Plan: Visit Diagnoses:  1. Posterior tibial tendon dysfunction (PTTD) of right lower extremity     Plan: Patient would like to resume work at restricted hours.  Will allow her to return to work for 2 hours a day for 4 weeks reevaluate in 4 weeks.  Follow-Up Instructions: Return in about 4 weeks (around 01/11/2022).   Ortho Exam  Patient is alert, oriented, no adenopathy, well-dressed, normal affect, normal respiratory effort. Examination patient has decreased swelling her foot is plantigrade she has dorsiflexion of the ankle to neutral.  Imaging: No results found. No images are attached to the encounter.  Labs: Lab Results  Component Value Date   HGBA1C  02/15/2009    5.8 (NOTE) The ADA recommends the following therapeutic goal for glycemic control related to Hgb A1c measurement: Goal of therapy: <6.5 Hgb A1c  Reference: American Diabetes Association: Clinical Practice Recommendations 2010, Diabetes Care, 2010, 33: (Suppl  1).     Lab Results  Component Value Date   ALBUMIN 3.0 (L) 07/13/2007   ALBUMIN 3.2 (L) 07/12/2007   ALBUMIN 3.4 (L) 07/11/2007    No results found for: "MG" No results found for: "VD25OH"  No results found for: "PREALBUMIN"    Latest Ref Rng & Units 05/12/2021    8:13 AM 07/30/2012    9:07 AM  02/14/2009    9:12 PM  CBC EXTENDED  WBC 4.0 - 10.5 K/uL 6.1   6.9   RBC 3.87 - 5.11 MIL/uL 4.88   4.48   Hemoglobin 12.0 - 15.0 g/dL 14.4  13.4  12.8   HCT 36.0 - 46.0 % 42.8   37.3   Platelets 150 - 400 K/uL 274   278   NEUT# 1.7 - 7.7 K/uL   3.9   Lymph# 0.7 - 4.0 K/uL   2.5      There is no height or weight on file to calculate BMI.  Orders:  No orders of the defined types were placed in this encounter.  No orders of the defined types were placed in this encounter.    Procedures: No procedures performed  Clinical Data: No additional findings.  ROS:  All other systems negative, except as noted in the HPI. Review of Systems  Objective: Vital Signs: There were no vitals taken for this visit.  Specialty Comments:  No specialty comments available.  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   Cough 02/28/2011  Past Medical History:  Diagnosis Date   Allergic rhinitis    Anxiety    Hypertension    Panic attacks    Seasonal allergies     Family History  Problem Relation Age of Onset   Hypertension Mother    Cancer Mother        skin   Hypertension Father    Cancer Father        skin    Past Surgical History:  Procedure Laterality Date   ANTERIOR FUSION CERVICAL SPINE     BREAST LUMPECTOMY WITH NEEDLE LOCALIZATION Right 07/30/2012   Procedure: RIGHT BREAST WIRE LOCALIZATION LUMPECTOMY ;  Surgeon: Merrie Roof, MD;  Location: [facility];  Service: General;  Laterality: Right;   BREAST SURGERY     CHOLECYSTECTOMY  07/09/2007   lap choli   DIAGNOSTIC LAPAROSCOPY  01/09/2008   ectopic preg rt   FOOT ARTHRODESIS Right 05/12/2021   Procedure: RIGHT SUBTALAR AND TALONAVICULAR FUSION;   Surgeon: Newt Minion, MD;  Location: [facility];  Service: Orthopedics;  Laterality: Right;   Social History   Occupational History   Not on file  Tobacco Use   Smoking status: Never   Smokeless tobacco: Never  Vaping Use   Vaping Use: Never used  Substance and Sexual Activity   Alcohol use: No   Drug use: No   Sexual activity: Not on file

## 2021-12-15 ENCOUNTER — Telehealth: Payer: Self-pay | Admitting: Orthopedic Surgery

## 2021-12-15 NOTE — Telephone Encounter (Signed)
[employer] forms received. To [employer]. °

## 2022-01-11 ENCOUNTER — Ambulatory Visit (INDEPENDENT_AMBULATORY_CARE_PROVIDER_SITE_OTHER): Payer: Managed Care, Other (non HMO) | Admitting: Orthopedic Surgery

## 2022-01-11 DIAGNOSIS — M79671 Pain in right foot: Secondary | ICD-10-CM | POA: Diagnosis not present

## 2022-01-11 DIAGNOSIS — M76821 Posterior tibial tendinitis, right leg: Secondary | ICD-10-CM | POA: Diagnosis not present

## 2022-01-16 ENCOUNTER — Encounter: Payer: Self-pay | Admitting: Orthopedic Surgery

## 2022-01-16 NOTE — Progress Notes (Signed)
Office Visit Note   Patient: [name]           Date of Birth: November 25, 1965           MRN: [id number] Visit Date: 01/11/2022              Requested by: Haywood Pao, MD [address] [place],  [place] [address] PCP: Haywood Pao, MD  Chief Complaint  Patient presents with   Right Foot - Follow-up    05/12/2021 right subtalar and talonavicular fusion           HPI: The patient is a 57 year old woman who is 6 months status post right subtalar and talonavicular fusion for posterior tibial tendon insufficiency.  Patient states she is currently working 2 hours a day and does start having pain after an hour and a half.  She is currently new balance sneakers and knee-high compression socks.  Assessment & Plan: Visit Diagnoses:  1. Posterior tibial tendon dysfunction (PTTD) of right lower extremity   2. Pain in right foot     Plan: Plan to continue 2 hours of working per day for the next 6 weeks.  Follow-Up Instructions: Return in about 2 months (around 03/12/2022).   Ortho Exam  Patient is alert, oriented, no adenopathy, well-dressed, normal affect, normal respiratory effort. Patient states that recently she has been having ankle symptoms that her ankle gets stuck and will not move and have some popping in her ankle.  She has no pain with passive range of motion of the ankle.  She is does have swelling in the foot and leg.  Recommended a trial of Voltaren gel.  Imaging: No results found. No images are attached to the encounter.  Labs: Lab Results  Component Value Date   HGBA1C  02/15/2009    5.8 (NOTE) The ADA recommends the following therapeutic goal for glycemic control related to Hgb A1c measurement: Goal of therapy: <6.5 Hgb A1c  Reference: American Diabetes Association: Clinical Practice Recommendations 2010, Diabetes Care, 2010, 33: (Suppl  1).     Lab Results  Component Value Date   ALBUMIN 3.0 (L) 07/13/2007   ALBUMIN 3.2 (L) 07/12/2007   ALBUMIN  3.4 (L) 07/11/2007    No results found for: "MG" No results found for: "VD25OH"  No results found for: "PREALBUMIN"    Latest Ref Rng & Units 05/12/2021    8:13 AM 07/30/2012    9:07 AM 02/14/2009    9:12 PM  CBC EXTENDED  WBC 4.0 - 10.5 K/uL 6.1   6.9   RBC 3.87 - 5.11 MIL/uL 4.88   4.48   Hemoglobin 12.0 - 15.0 g/dL 14.4  13.4  12.8   HCT 36.0 - 46.0 % 42.8   37.3   Platelets 150 - 400 K/uL 274   278   NEUT# 1.7 - 7.7 K/uL   3.9   Lymph# 0.7 - 4.0 K/uL   2.5      There is no height or weight on file to calculate BMI.  Orders:  No orders of the defined types were placed in this encounter.  No orders of the defined types were placed in this encounter.    Procedures: No procedures performed  Clinical Data: No additional findings.  ROS:  All other systems negative, except as noted in the HPI. Review of Systems  Objective: Vital Signs: There were no vitals taken for this visit.  Specialty Comments:  No specialty comments available.  PMFS History: Patient Active Problem  List   Diagnosis Date Noted   Posterior tibial tendon dysfunction (PTTD) of right lower extremity    Chronic venous insufficiency 02/23/2019   Varicose veins of both lower extremities 02/23/2019   Bilateral leg pain 02/23/2019   Papilloma of breast 07/07/2012   Cough 02/28/2011   Past Medical History:  Diagnosis Date   Allergic rhinitis    Anxiety    Hypertension    Panic attacks    Seasonal allergies     Family History  Problem Relation Age of Onset   Hypertension Mother    Cancer Mother        skin   Hypertension Father    Cancer Father        skin    Past Surgical History:  Procedure Laterality Date   ANTERIOR FUSION CERVICAL SPINE     BREAST LUMPECTOMY WITH NEEDLE LOCALIZATION Right 07/30/2012   Procedure: RIGHT BREAST WIRE LOCALIZATION LUMPECTOMY ;  Surgeon: Merrie Roof, MD;  Location: [facility];  Service: General;  Laterality: Right;   BREAST SURGERY      CHOLECYSTECTOMY  07/09/2007   lap choli   DIAGNOSTIC LAPAROSCOPY  01/09/2008   ectopic preg rt   FOOT ARTHRODESIS Right 05/12/2021   Procedure: RIGHT SUBTALAR AND TALONAVICULAR FUSION;  Surgeon: Newt Minion, MD;  Location: [facility];  Service: Orthopedics;  Laterality: Right;   Social History   Occupational History   Not on file  Tobacco Use   Smoking status: Never   Smokeless tobacco: Never  Vaping Use   Vaping Use: Never used  Substance and Sexual Activity   Alcohol use: No   Drug use: No   Sexual activity: Not on file

## 2022-02-22 ENCOUNTER — Ambulatory Visit: Payer: Managed Care, Other (non HMO) | Admitting: Orthopedic Surgery

## 2022-02-26 ENCOUNTER — Ambulatory Visit: Payer: Managed Care, Other (non HMO) | Admitting: Orthopedic Surgery

## 2022-02-26 DIAGNOSIS — M79671 Pain in right foot: Secondary | ICD-10-CM | POA: Diagnosis not present

## 2022-02-26 DIAGNOSIS — M76821 Posterior tibial tendinitis, right leg: Secondary | ICD-10-CM

## 2022-03-05 ENCOUNTER — Encounter: Payer: Self-pay | Admitting: Orthopedic Surgery

## 2022-03-05 NOTE — Progress Notes (Signed)
Office Visit Note   Patient: [name]           Date of Birth: Mar 20, 1965           MRN: [id number] Visit Date: 02/26/2022              Requested by: Haywood Pao, MD [address] [place],  [place] [address] PCP: Haywood Pao, MD  Chief Complaint  Patient presents with   Right Foot - Follow-up    05/12/2021 right subtalar and talonavicular fusion      HPI: Patient is a 57 year old woman who presents in follow-up status post subtalar and talonavicular fusion.  Patient is currently working 2 hours a day.  Patient states she has burning pain over the dorsum of her foot after she developed swelling.  Assessment & Plan: Visit Diagnoses:  1. Posterior tibial tendon dysfunction (PTTD) of right lower extremity   2. Pain in right foot     Plan: Continue working 2 hours a day for 2 months.  Continue with her compression socks.  Follow-Up Instructions: Return in about 2 months (around 04/27/2022).   Ortho Exam  Patient is alert, oriented, no adenopathy, well-dressed, normal affect, normal respiratory effort. Examination patient's calf is 45 cm in circumference she has an extra-large sock.  She does have varicose veins and increased swelling in her legs with pitting edema.  Patient states this is worse after ambulation.  Pain is better with ice.  The subtalar and talonavicular fusion sites are nontender to palpation.  The posterior tibial tendon and peroneal tendons are nontender to palpation.  Patient has pain with increased dorsiflexion of the ankle she has passive range of motion to 90 degrees.  Imaging: No results found. No images are attached to the encounter.  Labs: Lab Results  Component Value Date   HGBA1C  02/15/2009    5.8 (NOTE) The ADA recommends the following therapeutic goal for glycemic control related to Hgb A1c measurement: Goal of therapy: <6.5 Hgb A1c  Reference: American Diabetes Association: Clinical Practice Recommendations 2010, Diabetes Care,  2010, 33: (Suppl  1).     Lab Results  Component Value Date   ALBUMIN 3.0 (L) 07/13/2007   ALBUMIN 3.2 (L) 07/12/2007   ALBUMIN 3.4 (L) 07/11/2007    No results found for: "MG" No results found for: "VD25OH"  No results found for: "PREALBUMIN"    Latest Ref Rng & Units 05/12/2021    8:13 AM 07/30/2012    9:07 AM 02/14/2009    9:12 PM  CBC EXTENDED  WBC 4.0 - 10.5 K/uL 6.1   6.9   RBC 3.87 - 5.11 MIL/uL 4.88   4.48   Hemoglobin 12.0 - 15.0 g/dL 14.4  13.4  12.8   HCT 36.0 - 46.0 % 42.8   37.3   Platelets 150 - 400 K/uL 274   278   NEUT# 1.7 - 7.7 K/uL   3.9   Lymph# 0.7 - 4.0 K/uL   2.5      There is no height or weight on file to calculate BMI.  Orders:  No orders of the defined types were placed in this encounter.  No orders of the defined types were placed in this encounter.    Procedures: No procedures performed  Clinical Data: No additional findings.  ROS:  All other systems negative, except as noted in the HPI. Review of Systems  Objective: Vital Signs: There were no vitals taken for this visit.  Specialty Comments:  No  specialty comments available.  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   Cough 02/28/2011   Past Medical History:  Diagnosis Date   Allergic rhinitis    Anxiety    Hypertension    Panic attacks    Seasonal allergies     Family History  Problem Relation Age of Onset   Hypertension Mother    Cancer Mother        skin   Hypertension Father    Cancer Father        skin    Past Surgical History:  Procedure Laterality Date   ANTERIOR FUSION CERVICAL SPINE     BREAST LUMPECTOMY WITH NEEDLE LOCALIZATION Right 07/30/2012   Procedure: RIGHT BREAST WIRE LOCALIZATION  LUMPECTOMY ;  Surgeon: Merrie Roof, MD;  Location: [facility];  Service: General;  Laterality: Right;   BREAST SURGERY     CHOLECYSTECTOMY  07/09/2007   lap choli   DIAGNOSTIC LAPAROSCOPY  01/09/2008   ectopic preg rt   FOOT ARTHRODESIS Right 05/12/2021   Procedure: RIGHT SUBTALAR AND TALONAVICULAR FUSION;  Surgeon: Newt Minion, MD;  Location: [facility];  Service: Orthopedics;  Laterality: Right;   Social History   Occupational History   Not on file  Tobacco Use   Smoking status: Never   Smokeless tobacco: Never  Vaping Use   Vaping Use: Never used  Substance and Sexual Activity   Alcohol use: No   Drug use: No   Sexual activity: Not on file

## 2022-04-12 ENCOUNTER — Encounter: Payer: Self-pay | Admitting: Orthopedic Surgery

## 2022-04-12 ENCOUNTER — Ambulatory Visit: Payer: Managed Care, Other (non HMO) | Admitting: Orthopedic Surgery

## 2022-04-12 DIAGNOSIS — M76821 Posterior tibial tendinitis, right leg: Secondary | ICD-10-CM

## 2022-04-12 NOTE — Progress Notes (Signed)
Office Visit Note   Patient: [name]           Date of Birth: 1965-09-30           MRN: [id number] Visit Date: 04/12/2022              Requested by: Haywood Pao, MD [address] [place],  [place] [address] PCP: Haywood Pao, MD  Chief Complaint  Patient presents with   Right Foot - Follow-up    05/12/2021 right subtalar and talonavicular fusion      HPI: Patient is a 57 year old woman who is seen in follow-up status post right subtalar fusion.  Patient states she is working about 2 hours a day continuing with her compression sock.  Assessment & Plan: Visit Diagnoses:  1. Posterior tibial tendon dysfunction (PTTD) of right lower extremity     Plan: Patient would like to try to increase her work to 2 to 4 hours/day as tolerated, and note was provided.  I have recommended patient pursue Social Security long-term disability.  Follow-Up Instructions: Return in about 4 weeks (around 05/10/2022).   Ortho Exam  Patient is alert, oriented, no adenopathy, well-dressed, normal affect, normal respiratory effort. Examination patient's incision is well-healed there is no redness no cellulitis she has dorsiflexion to neutral.  She has no pain over the posterior tibial tendon pain primarily dorsally over the ankle.  Imaging: No results found. No images are attached to the encounter.  Labs: Lab Results  Component Value Date   HGBA1C  02/15/2009    5.8 (NOTE) The ADA recommends the following therapeutic goal for glycemic control related to Hgb A1c measurement: Goal of therapy: <6.5 Hgb A1c  Reference: American Diabetes Association: Clinical Practice Recommendations 2010, Diabetes Care, 2010, 33: (Suppl  1).     Lab Results  Component Value Date   ALBUMIN 3.0 (L) 07/13/2007   ALBUMIN 3.2 (L) 07/12/2007   ALBUMIN 3.4 (L) 07/11/2007    No results found for: "MG" No results found for: "VD25OH"  No results found for: "PREALBUMIN"    Latest Ref Rng & Units  05/12/2021    8:13 AM 07/30/2012    9:07 AM 02/14/2009    9:12 PM  CBC EXTENDED  WBC 4.0 - 10.5 K/uL 6.1   6.9   RBC 3.87 - 5.11 MIL/uL 4.88   4.48   Hemoglobin 12.0 - 15.0 g/dL 14.4  13.4  12.8   HCT 36.0 - 46.0 % 42.8   37.3   Platelets 150 - 400 K/uL 274   278   NEUT# 1.7 - 7.7 K/uL   3.9   Lymph# 0.7 - 4.0 K/uL   2.5      There is no height or weight on file to calculate BMI.  Orders:  No orders of the defined types were placed in this encounter.  No orders of the defined types were placed in this encounter.    Procedures: No procedures performed  Clinical Data: No additional findings.  ROS:  All other systems negative, except as noted in the HPI. Review of Systems  Objective: Vital Signs: There were no vitals taken for this visit.  Specialty Comments:  No specialty comments available.  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  Cough 02/28/2011   Past Medical History:  Diagnosis Date   Allergic rhinitis    Anxiety    Hypertension    Panic attacks    Seasonal allergies     Family History  Problem Relation Age of Onset   Hypertension Mother    Cancer Mother        skin   Hypertension Father    Cancer Father        skin    Past Surgical History:  Procedure Laterality Date   ANTERIOR FUSION CERVICAL SPINE     BREAST LUMPECTOMY WITH NEEDLE LOCALIZATION Right 07/30/2012   Procedure: RIGHT BREAST WIRE LOCALIZATION LUMPECTOMY ;  Surgeon: Merrie Roof, MD;  Location: [facility];  Service: General;  Laterality: Right;   BREAST SURGERY     CHOLECYSTECTOMY  07/09/2007   lap choli   DIAGNOSTIC LAPAROSCOPY  01/09/2008   ectopic preg rt   FOOT ARTHRODESIS Right 05/12/2021   Procedure:  RIGHT SUBTALAR AND TALONAVICULAR FUSION;  Surgeon: Newt Minion, MD;  Location: [facility];  Service: Orthopedics;  Laterality: Right;   Social History   Occupational History   Not on file  Tobacco Use   Smoking status: Never   Smokeless tobacco: Never  Vaping Use   Vaping Use: Never used  Substance and Sexual Activity   Alcohol use: No   Drug use: No   Sexual activity: Not on file

## 2022-04-13 ENCOUNTER — Telehealth: Payer: Self-pay | Admitting: Orthopedic Surgery

## 2022-04-13 NOTE — Telephone Encounter (Signed)
[employer] forms received. To [employer]. 

## 2022-04-23 ENCOUNTER — Encounter: Payer: Self-pay | Admitting: Orthopedic Surgery

## 2022-05-01 ENCOUNTER — Other Ambulatory Visit (INDEPENDENT_AMBULATORY_CARE_PROVIDER_SITE_OTHER): Payer: Managed Care, Other (non HMO)

## 2022-05-01 ENCOUNTER — Ambulatory Visit: Payer: Managed Care, Other (non HMO) | Admitting: Orthopedic Surgery

## 2022-05-01 DIAGNOSIS — M79671 Pain in right foot: Secondary | ICD-10-CM

## 2022-05-01 DIAGNOSIS — M76821 Posterior tibial tendinitis, right leg: Secondary | ICD-10-CM | POA: Diagnosis not present

## 2022-05-06 ENCOUNTER — Encounter: Payer: Self-pay | Admitting: Orthopedic Surgery

## 2022-05-06 NOTE — Progress Notes (Signed)
Office Visit Note   Patient: [name]           Date of Birth: 04-01-1965           MRN: [id number] Visit Date: 05/01/2022              Requested by: Gaspar Garbe, MD [address] [place],  [place] [address] PCP: Wylene Simmer Adelfa Koh, MD  Chief Complaint  Patient presents with   Right Foot - Pain      HPI: Patient is a 57 year old woman status post subtalar and talonavicular fusion for posterior tibial tendon insufficiency.  Patient states that she has pain anteriorly over the ankle and pain over the lateral fifth metatarsal.  Assessment & Plan: Visit Diagnoses:  1. Pain in right foot   2. Posterior tibial tendon dysfunction (PTTD) of right lower extremity     Plan: Patient's symptoms seem to be consistent with Achilles contracture.  She will continue to work on dorsiflexion stretching we will give her a 9/16 and a 716 since heel lift to see if this will help unload the ankle during ambulation.  Follow-Up Instructions: Return in about 4 weeks (around 05/29/2022).   Ortho Exam  Patient is alert, oriented, no adenopathy, well-dressed, normal affect, normal respiratory effort. Examination patient has pain to palpation anteriorly over the ankle and laterally over the fifth metatarsal.  There is no skin breakdown.  She has dorsiflexion 10 degrees short of neutral with the knee extended consistent with her symptoms.  Imaging: No results found. No images are attached to the encounter.  Labs: Lab Results  Component Value Date   HGBA1C  02/15/2009    5.8 (NOTE) The ADA recommends the following therapeutic goal for glycemic control related to Hgb A1c measurement: Goal of therapy: <6.5 Hgb A1c  Reference: American Diabetes Association: Clinical Practice Recommendations 2010, Diabetes Care, 2010, 33: (Suppl  1).     Lab Results  Component Value Date   ALBUMIN 3.0 (L) 07/13/2007   ALBUMIN 3.2 (L) 07/12/2007   ALBUMIN 3.4 (L) 07/11/2007    No results found for:  "MG" No results found for: "VD25OH"  No results found for: "PREALBUMIN"    Latest Ref Rng & Units 05/12/2021    8:13 AM 07/30/2012    9:07 AM 02/14/2009    9:12 PM  CBC EXTENDED  WBC 4.0 - 10.5 K/uL 6.1   6.9   RBC 3.87 - 5.11 MIL/uL 4.88   4.48   Hemoglobin 12.0 - 15.0 g/dL 19.1  47.8  29.5   HCT 36.0 - 46.0 % 42.8   37.3   Platelets 150 - 400 K/uL 274   278   NEUT# 1.7 - 7.7 K/uL   3.9   Lymph# 0.7 - 4.0 K/uL   2.5      There is no height or weight on file to calculate BMI.  Orders:  Orders Placed This Encounter  Procedures   XR Foot Complete Right   No orders of the defined types were placed in this encounter.    Procedures: No procedures performed  Clinical Data: No additional findings.  ROS:  All other systems negative, except as noted in the HPI. Review of Systems  Objective: Vital Signs: There were no vitals taken for this visit.  Specialty Comments:  No specialty comments available.  PMFS History: Patient Active Problem List   Diagnosis Date Noted   Posterior tibial tendon dysfunction (PTTD) of right lower extremity    Chronic venous insufficiency 02/23/2019  Varicose veins of both lower extremities 02/23/2019   Bilateral leg pain 02/23/2019   Papilloma of breast 07/07/2012   Cough 02/28/2011   Past Medical History:  Diagnosis Date   Allergic rhinitis    Anxiety    Hypertension    Panic attacks    Seasonal allergies     Family History  Problem Relation Age of Onset   Hypertension Mother    Cancer Mother        skin   Hypertension Father    Cancer Father        skin    Past Surgical History:  Procedure Laterality Date   ANTERIOR FUSION CERVICAL SPINE     BREAST LUMPECTOMY WITH NEEDLE LOCALIZATION Right 07/30/2012   Procedure: RIGHT BREAST WIRE LOCALIZATION LUMPECTOMY ;  Surgeon: Robyne Askew, MD;  Location: [facility];  Service: General;  Laterality: Right;   BREAST SURGERY     CHOLECYSTECTOMY  07/09/2007   lap  choli   DIAGNOSTIC LAPAROSCOPY  01/09/2008   ectopic preg rt   FOOT ARTHRODESIS Right 05/12/2021   Procedure: RIGHT SUBTALAR AND TALONAVICULAR FUSION;  Surgeon: Nadara Mustard, MD;  Location: [facility];  Service: Orthopedics;  Laterality: Right;   Social History   Occupational History   Not on file  Tobacco Use   Smoking status: Never   Smokeless tobacco: Never  Vaping Use   Vaping Use: Never used  Substance and Sexual Activity   Alcohol use: No   Drug use: No   Sexual activity: Not on file

## 2022-05-14 ENCOUNTER — Ambulatory Visit: Payer: Managed Care, Other (non HMO) | Admitting: Orthopedic Surgery

## 2022-05-31 ENCOUNTER — Ambulatory Visit: Payer: Managed Care, Other (non HMO) | Admitting: Orthopedic Surgery

## 2022-05-31 ENCOUNTER — Encounter: Payer: Self-pay | Admitting: Orthopedic Surgery

## 2022-05-31 DIAGNOSIS — M25571 Pain in right ankle and joints of right foot: Secondary | ICD-10-CM | POA: Diagnosis not present

## 2022-05-31 NOTE — Progress Notes (Signed)
Office Visit Note   Patient: [name]           Date of Birth: January 03, 1966           MRN: [id number] Visit Date: 05/31/2022              Requested by: Gaspar Garbe, MD [address] [place],  [place] [address] PCP: Gaspar Garbe, MD  Chief Complaint  Patient presents with   Right Foot - Pain    05/12/2021 right subtalar and talonavicular fusion      HPI: Patient is a 57 year old woman who is 1 year status post subtalar and talonavicular fusion.  Patient has been having anterior ankle pain difficulty workin more than 3 or 4 hours.  She states that the thicker heel wedge improves her symptoms.  Assessment & Plan: Visit Diagnoses:  1. Acute right ankle pain     Plan: Note was provided for patient continue work up to 4 hours a day for 4 weeks she will be heading to the beach and work on walking on the from sand at [employer] to work on [employer].  She will continue working on dorsiflexion stretching of her ankle.  Follow-Up Instructions: Return in about 4 weeks (around 06/28/2022).   Ortho Exam  Patient is alert, oriented, no adenopathy, well-dressed, normal affect, normal respiratory effort. Examination patient has minimal swelling in the right foot she has dorsiflexion of the ankle to neutral pain to palpation anteriorly over the ankle talonavicular and sinus Tarsi are nontender to palpation.  There are no skin color or temperature changes no dystrophic changes.  Patient has occasional shooting pain anteriorly over the ankle.  Imaging: No results found. No images are attached to the encounter.  Labs: Lab Results  Component Value Date   HGBA1C  02/15/2009    5.8 (NOTE) The ADA recommends the following therapeutic goal for glycemic control related to Hgb A1c measurement: Goal of therapy: <6.5 Hgb A1c  Reference: American Diabetes Association: Clinical Practice Recommendations 2010, Diabetes Care, 2010, 33: (Suppl  1).     Lab Results   Component Value Date   ALBUMIN 3.0 (L) 07/13/2007   ALBUMIN 3.2 (L) 07/12/2007   ALBUMIN 3.4 (L) 07/11/2007    No results found for: "MG" No results found for: "VD25OH"  No results found for: "PREALBUMIN"    Latest Ref Rng & Units 05/12/2021    8:13 AM 07/30/2012    9:07 AM 02/14/2009    9:12 PM  CBC EXTENDED  WBC 4.0 - 10.5 K/uL 6.1   6.9   RBC 3.87 - 5.11 MIL/uL 4.88   4.48   Hemoglobin 12.0 - 15.0 g/dL 21.3  08.6  57.8   HCT 36.0 - 46.0 % 42.8   37.3   Platelets 150 - 400 K/uL 274   278   NEUT# 1.7 - 7.7 K/uL   3.9   Lymph# 0.7 - 4.0 K/uL   2.5      There is no height or weight on file to calculate BMI.  Orders:  No orders of the defined types were placed in this encounter.  No orders of the defined types were placed in this encounter.    Procedures: No procedures performed  Clinical Data: No additional findings.  ROS:  All other systems negative, except as noted in the HPI. Review of Systems  Objective: Vital Signs: There were no vitals taken for this visit.  Specialty Comments:  No specialty comments available.  PMFS History:  Patient Active Problem List   Diagnosis Date Noted   Posterior tibial tendon dysfunction (PTTD) of right lower extremity    Chronic venous insufficiency 02/23/2019   Varicose veins of both lower extremities 02/23/2019   Bilateral leg pain 02/23/2019   Papilloma of breast 07/07/2012   Cough 02/28/2011   Past Medical History:  Diagnosis Date   Allergic rhinitis    Anxiety    Hypertension    Panic attacks    Seasonal allergies     Family History  Problem Relation Age of Onset   Hypertension Mother    Cancer Mother        skin   Hypertension Father    Cancer Father        skin    Past Surgical History:  Procedure Laterality Date   ANTERIOR FUSION CERVICAL SPINE     BREAST LUMPECTOMY WITH NEEDLE LOCALIZATION Right 07/30/2012   Procedure: RIGHT BREAST WIRE LOCALIZATION LUMPECTOMY ;  Surgeon: Robyne Askew, MD;   Location: [facility];  Service: General;  Laterality: Right;   BREAST SURGERY     CHOLECYSTECTOMY  07/09/2007   lap choli   DIAGNOSTIC LAPAROSCOPY  01/09/2008   ectopic preg rt   FOOT ARTHRODESIS Right 05/12/2021   Procedure: RIGHT SUBTALAR AND TALONAVICULAR FUSION;  Surgeon: Nadara Mustard, MD;  Location: [facility];  Service: Orthopedics;  Laterality: Right;   Social History   Occupational History   Not on file  Tobacco Use   Smoking status: Never   Smokeless tobacco: Never  Vaping Use   Vaping Use: Never used  Substance and Sexual Activity   Alcohol use: No   Drug use: No   Sexual activity: Not on file

## 2022-06-28 ENCOUNTER — Ambulatory Visit: Payer: Managed Care, Other (non HMO) | Admitting: Orthopedic Surgery

## 2022-06-28 DIAGNOSIS — M79671 Pain in right foot: Secondary | ICD-10-CM | POA: Diagnosis not present

## 2022-06-28 DIAGNOSIS — M76821 Posterior tibial tendinitis, right leg: Secondary | ICD-10-CM

## 2022-06-28 DIAGNOSIS — Z981 Arthrodesis status: Secondary | ICD-10-CM

## 2022-07-02 ENCOUNTER — Encounter: Payer: Self-pay | Admitting: Orthopedic Surgery

## 2022-07-02 NOTE — Progress Notes (Signed)
Office Visit Note   Patient: [name]           Date of Birth: 05/26/1965           MRN: [id number] Visit Date: 06/28/2022              Requested by: Gaspar Garbe, MD [address] [place],  [place] [address] PCP: Gaspar Garbe, MD  Chief Complaint  Patient presents with   Right Foot - Follow-up    05/12/2021 right subtalar and talonavicular fusion         HPI: Patient is a 57 year old woman who is 1 year status post right subtalar and talonavicular fusion.  Patient states she is slowly getting better she states she still has difficulty working more than 3-1/2 hours.  She is wearing a knee-high compression sock.  Patient states that the heel wedge in her shoe has helped a lot.  Assessment & Plan: Visit Diagnoses:  1. Pain in right foot   2. Posterior tibial tendon dysfunction (PTTD) of right lower extremity   3. S/P ankle fusion     Plan: Patient will continue with fascial strengthening.  She will continue with her Voltaren gel.  Increase her activities as tolerated.  Follow-Up Instructions: Return in about 2 months (around 08/28/2022).   Ortho Exam  Patient is alert, oriented, no adenopathy, well-dressed, normal affect, normal respiratory effort. Examination patient has no pain to palpation over the talonavicular or pain to palpation over the sinus Tarsi.  Patient has pain over the dorsal lateral aspect of her ankle after working 3-1/2 hours.  She has good dorsiflexion of the ankle good range of motion of her toes.  Imaging: No results found. No images are attached to the encounter.  Labs: Lab Results  Component Value Date   HGBA1C  02/15/2009    5.8 (NOTE) The ADA recommends the following therapeutic goal for glycemic control related to Hgb A1c measurement: Goal of therapy: <6.5 Hgb A1c  Reference: American Diabetes Association: Clinical Practice Recommendations 2010, Diabetes Care, 2010, 33: (Suppl  1).     Lab Results  Component Value Date    ALBUMIN 3.0 (L) 07/13/2007   ALBUMIN 3.2 (L) 07/12/2007   ALBUMIN 3.4 (L) 07/11/2007    No results found for: "MG" No results found for: "VD25OH"  No results found for: "PREALBUMIN"    Latest Ref Rng & Units 05/12/2021    8:13 AM 07/30/2012    9:07 AM 02/14/2009    9:12 PM  CBC EXTENDED  WBC 4.0 - 10.5 K/uL 6.1   6.9   RBC 3.87 - 5.11 MIL/uL 4.88   4.48   Hemoglobin 12.0 - 15.0 g/dL 78.2  95.6  21.3   HCT 36.0 - 46.0 % 42.8   37.3   Platelets 150 - 400 K/uL 274   278   NEUT# 1.7 - 7.7 K/uL   3.9   Lymph# 0.7 - 4.0 K/uL   2.5      There is no height or weight on file to calculate BMI.  Orders:  No orders of the defined types were placed in this encounter.  No orders of the defined types were placed in this encounter.    Procedures: No procedures performed  Clinical Data: No additional findings.  ROS:  All other systems negative, except as noted in the HPI. Review of Systems  Objective: Vital Signs: There were no vitals taken for this visit.  Specialty Comments:  No specialty comments available.  PMFS  History: Patient Active Problem List   Diagnosis Date Noted   Posterior tibial tendon dysfunction (PTTD) of right lower extremity    Chronic venous insufficiency 02/23/2019   Varicose veins of both lower extremities 02/23/2019   Bilateral leg pain 02/23/2019   Papilloma of breast 07/07/2012   Cough 02/28/2011   Past Medical History:  Diagnosis Date   Allergic rhinitis    Anxiety    Hypertension    Panic attacks    Seasonal allergies     Family History  Problem Relation Age of Onset   Hypertension Mother    Cancer Mother        skin   Hypertension Father    Cancer Father        skin    Past Surgical History:  Procedure Laterality Date   ANTERIOR FUSION CERVICAL SPINE     BREAST LUMPECTOMY WITH NEEDLE LOCALIZATION Right 07/30/2012   Procedure: RIGHT BREAST WIRE LOCALIZATION LUMPECTOMY ;  Surgeon: Robyne Askew, MD;  Location: [facility];  Service: General;  Laterality: Right;   BREAST SURGERY     CHOLECYSTECTOMY  07/09/2007   lap choli   DIAGNOSTIC LAPAROSCOPY  01/09/2008   ectopic preg rt   FOOT ARTHRODESIS Right 05/12/2021   Procedure: RIGHT SUBTALAR AND TALONAVICULAR FUSION;  Surgeon: Nadara Mustard, MD;  Location: [facility];  Service: Orthopedics;  Laterality: Right;   Social History   Occupational History   Not on file  Tobacco Use   Smoking status: Never   Smokeless tobacco: Never  Vaping Use   Vaping Use: Never used  Substance and Sexual Activity   Alcohol use: No   Drug use: No   Sexual activity: Not on file

## 2022-08-27 ENCOUNTER — Encounter: Payer: Self-pay | Admitting: Orthopedic Surgery

## 2022-08-30 ENCOUNTER — Ambulatory Visit: Payer: Managed Care, Other (non HMO) | Admitting: Orthopedic Surgery

## 2022-08-30 ENCOUNTER — Encounter: Payer: Self-pay | Admitting: Orthopedic Surgery

## 2022-08-30 DIAGNOSIS — M79671 Pain in right foot: Secondary | ICD-10-CM | POA: Diagnosis not present

## 2022-08-30 NOTE — Progress Notes (Signed)
Office Visit Note   Patient: [name]           Date of Birth: 12-22-1965           MRN: [id number] Visit Date: 08/30/2022              Requested by: Gaspar Garbe, MD [address] [place],  [place] [address] PCP: Gaspar Garbe, MD  Chief Complaint  Patient presents with   Right Foot - Follow-up     05/12/2021 right subtalar and talonavicular fusion          HPI: Patient is a 57 year old woman who was walking on the beach feels like she may have stepped on something and embedded a foreign body.  Assessment & Plan: Visit Diagnoses:  1. Pain in right foot     Plan: Patient does have dry cracked skin recommend moisturizing recommended continuing with the Vive sock to decrease the fungal load.  No signs of foreign body.  Patient was provided a prescription to continue working 4 hours a day.  Follow-Up Instructions: No follow-ups on file.   Ortho Exam  Patient is alert, oriented, no adenopathy, well-dressed, normal affect, normal respiratory effort. Examination patient has some dry cracked skin there is a very small area of skin tear this was debrided with a 10 blade knife there is no foreign bodies no redness no cellulitis no drainage.  Imaging: No results found. No images are attached to the encounter.  Labs: Lab Results  Component Value Date   HGBA1C  02/15/2009    5.8 (NOTE) The ADA recommends the following therapeutic goal for glycemic control related to Hgb A1c measurement: Goal of therapy: <6.5 Hgb A1c  Reference: American Diabetes Association: Clinical Practice Recommendations 2010, Diabetes Care, 2010, 33: (Suppl  1).     Lab Results  Component Value Date   ALBUMIN 3.0 (L) 07/13/2007   ALBUMIN 3.2 (L) 07/12/2007   ALBUMIN 3.4 (L) 07/11/2007    No results found for: "MG" No results found for: "VD25OH"  No results found for: "PREALBUMIN"    Latest Ref Rng & Units 05/12/2021    8:13 AM 07/30/2012    9:07 AM 02/14/2009    9:12 PM  CBC  EXTENDED  WBC 4.0 - 10.5 K/uL 6.1   6.9   RBC 3.87 - 5.11 MIL/uL 4.88   4.48   Hemoglobin 12.0 - 15.0 g/dL 29.5  28.4  13.2   HCT 36.0 - 46.0 % 42.8   37.3   Platelets 150 - 400 K/uL 274   278   NEUT# 1.7 - 7.7 K/uL   3.9   Lymph# 0.7 - 4.0 K/uL   2.5      There is no height or weight on file to calculate BMI.  Orders:  No orders of the defined types were placed in this encounter.  No orders of the defined types were placed in this encounter.    Procedures: No procedures performed  Clinical Data: No additional findings.  ROS:  All other systems negative, except as noted in the HPI. Review of Systems  Objective: Vital Signs: There were no vitals taken for this visit.  Specialty Comments:  No specialty comments available.  PMFS History: Patient Active Problem List   Diagnosis Date Noted   Posterior tibial tendon dysfunction (PTTD) of right lower extremity    Chronic venous insufficiency 02/23/2019   Varicose veins of both lower extremities 02/23/2019   Bilateral leg pain 02/23/2019   Papilloma of breast  07/07/2012   Cough 02/28/2011   Past Medical History:  Diagnosis Date   Allergic rhinitis    Anxiety    Hypertension    Panic attacks    Seasonal allergies     Family History  Problem Relation Age of Onset   Hypertension Mother    Cancer Mother        skin   Hypertension Father    Cancer Father        skin    Past Surgical History:  Procedure Laterality Date   ANTERIOR FUSION CERVICAL SPINE     BREAST LUMPECTOMY WITH NEEDLE LOCALIZATION Right 07/30/2012   Procedure: RIGHT BREAST WIRE LOCALIZATION LUMPECTOMY ;  Surgeon: Robyne Askew, MD;  Location: [facility];  Service: General;  Laterality: Right;   BREAST SURGERY     CHOLECYSTECTOMY  07/09/2007   lap choli   DIAGNOSTIC LAPAROSCOPY  01/09/2008   ectopic preg rt   FOOT ARTHRODESIS Right 05/12/2021   Procedure: RIGHT SUBTALAR AND TALONAVICULAR FUSION;  Surgeon: Nadara Mustard, MD;   Location: [facility];  Service: Orthopedics;  Laterality: Right;   Social History   Occupational History   Not on file  Tobacco Use   Smoking status: Never   Smokeless tobacco: Never  Vaping Use   Vaping status: Never Used  Substance and Sexual Activity   Alcohol use: No   Drug use: No   Sexual activity: Not on file

## 2022-10-29 ENCOUNTER — Ambulatory Visit: Payer: Managed Care, Other (non HMO) | Admitting: Orthopedic Surgery

## 2022-10-29 ENCOUNTER — Encounter: Payer: Self-pay | Admitting: Orthopedic Surgery

## 2022-10-29 DIAGNOSIS — Z981 Arthrodesis status: Secondary | ICD-10-CM

## 2022-10-29 NOTE — Progress Notes (Signed)
Office Visit Note   Patient: [name]           Date of Birth: 09/30/1965           MRN: [id number] Visit Date: 10/29/2022              Requested by: Gaspar Garbe, MD [address] [place],  [place] [address] PCP: Wylene Simmer Adelfa Koh, MD  Chief Complaint  Patient presents with   Right Foot - Follow-up      HPI: Patient is a 57 year old woman who is status post talonavicular and subtalar fusion for posterior tibial tendon insufficiency.  Patient states she is able to work about 4 hours a day.  She states she has swelling at the end of the day.  She is wearing her compression socks.  Assessment & Plan: Visit Diagnoses: No diagnosis found.  Plan: Will obtain a CT scan to evaluate for fibrous union.  If there is insufficient cortical bridging of the fusion would need to proceed with a revision fusion.  Patient was provided a note to continue 4 hours of work per day for 2 months.  Patient is partially disabled.  Follow-Up Instructions: No follow-ups on file.   Ortho Exam  Patient is alert, oriented, no adenopathy, well-dressed, normal affect, normal respiratory effort. Examination there is minimal swelling this morning.  She is wearing a felt heel lift and a carbon plate.  She states she wakes up with stabbing pain in the foot she has been using Voltaren gel she feels pain over the posterior tibial tendon and the sinus Tarsi.  There is no redness or cellulitis.  Imaging: No results found. No images are attached to the encounter.  Labs: Lab Results  Component Value Date   HGBA1C  02/15/2009    5.8 (NOTE) The ADA recommends the following therapeutic goal for glycemic control related to Hgb A1c measurement: Goal of therapy: <6.5 Hgb A1c  Reference: American Diabetes Association: Clinical Practice Recommendations 2010, Diabetes Care, 2010, 33: (Suppl  1).     Lab Results  Component Value Date   ALBUMIN 3.0 (L) 07/13/2007   ALBUMIN 3.2 (L) 07/12/2007   ALBUMIN  3.4 (L) 07/11/2007    No results found for: "MG" No results found for: "VD25OH"  No results found for: "PREALBUMIN"    Latest Ref Rng & Units 05/12/2021    8:13 AM 07/30/2012    9:07 AM 02/14/2009    9:12 PM  CBC EXTENDED  WBC 4.0 - 10.5 K/uL 6.1   6.9   RBC 3.87 - 5.11 MIL/uL 4.88   4.48   Hemoglobin 12.0 - 15.0 g/dL 19.1  47.8  29.5   HCT 36.0 - 46.0 % 42.8   37.3   Platelets 150 - 400 K/uL 274   278   NEUT# 1.7 - 7.7 K/uL   3.9   Lymph# 0.7 - 4.0 K/uL   2.5      There is no height or weight on file to calculate BMI.  Orders:  No orders of the defined types were placed in this encounter.  No orders of the defined types were placed in this encounter.    Procedures: No procedures performed  Clinical Data: No additional findings.  ROS:  All other systems negative, except as noted in the HPI. Review of Systems  Objective: Vital Signs: There were no vitals taken for this visit.  Specialty Comments:  No specialty comments available.  PMFS History: Patient Active Problem List   Diagnosis Date  Noted   Posterior tibial tendon dysfunction (PTTD) of right lower extremity    Chronic venous insufficiency 02/23/2019   Varicose veins of both lower extremities 02/23/2019   Bilateral leg pain 02/23/2019   Papilloma of breast 07/07/2012   Cough 02/28/2011   Past Medical History:  Diagnosis Date   Allergic rhinitis    Anxiety    Hypertension    Panic attacks    Seasonal allergies     Family History  Problem Relation Age of Onset   Hypertension Mother    Cancer Mother        skin   Hypertension Father    Cancer Father        skin    Past Surgical History:  Procedure Laterality Date   ANTERIOR FUSION CERVICAL SPINE     BREAST LUMPECTOMY WITH NEEDLE LOCALIZATION Right 07/30/2012   Procedure: RIGHT BREAST WIRE LOCALIZATION LUMPECTOMY ;  Surgeon: Robyne Askew, MD;  Location: [facility];  Service: General;  Laterality: Right;   BREAST SURGERY      CHOLECYSTECTOMY  07/09/2007   lap choli   DIAGNOSTIC LAPAROSCOPY  01/09/2008   ectopic preg rt   FOOT ARTHRODESIS Right 05/12/2021   Procedure: RIGHT SUBTALAR AND TALONAVICULAR FUSION;  Surgeon: Nadara Mustard, MD;  Location: [facility];  Service: Orthopedics;  Laterality: Right;   Social History   Occupational History   Not on file  Tobacco Use   Smoking status: Never   Smokeless tobacco: Never  Vaping Use   Vaping status: Never Used  Substance and Sexual Activity   Alcohol use: No   Drug use: No   Sexual activity: Not on file

## 2022-10-31 ENCOUNTER — Other Ambulatory Visit: Payer: Managed Care, Other (non HMO)

## 2022-12-04 ENCOUNTER — Ambulatory Visit
Admission: RE | Admit: 2022-12-04 | Discharge: 2022-12-04 | Disposition: A | Discharge status: Home / Self Care | Payer: Managed Care, Other (non HMO) | Source: Ambulatory Visit | Attending: Orthopedic Surgery | Admitting: Orthopedic Surgery

## 2022-12-04 DIAGNOSIS — Z981 Arthrodesis status: Secondary | ICD-10-CM

## 2022-12-27 ENCOUNTER — Encounter: Payer: Self-pay | Admitting: Orthopedic Surgery

## 2022-12-27 ENCOUNTER — Ambulatory Visit: Payer: Managed Care, Other (non HMO) | Admitting: Orthopedic Surgery

## 2022-12-27 DIAGNOSIS — M25571 Pain in right ankle and joints of right foot: Secondary | ICD-10-CM | POA: Diagnosis not present

## 2022-12-27 DIAGNOSIS — Z981 Arthrodesis status: Secondary | ICD-10-CM

## 2022-12-27 NOTE — Progress Notes (Signed)
Office Visit Note   Patient: [name]           Date of Birth: 1965/04/02           MRN: [id number] Visit Date: 12/27/2022              Requested by: Gaspar Garbe, MD [address] [place],  [place] [address] PCP: Gaspar Garbe, MD  Chief Complaint  Patient presents with   Right Foot - Follow-up    CT scan review       HPI: Patient is a 57 year old woman who is status post right subtalar and talonavicular fusion May 2023.  Patient has been having pain over the dorsum of her foot.  She is currently working 16 hours a week.  Assessment & Plan: Visit Diagnoses:  1. S/P ankle fusion   2. 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  Will plan for revision fusion.  Patient states she is undergoing a workup for possible uterine cancer.  Patient will call when she is able to proceed with the revision fusion.  She will continue her current level of work.  Follow-Up Instructions: Return if symptoms worsen or fail to improve.   Ortho Exam  Patient is alert, oriented, no adenopathy, well-dressed, normal affect, normal respiratory effort. Examination patient is a good stiff new balance sneaker.  The dorsal lateral aspect of her foot is tender to palpation sinus Tarsi is not tender to palpation.  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  Imaging: No results found. No images are attached to the encounter.  Labs: Lab Results  Component Value Date   HGBA1C  02/15/2009    5.8 (NOTE) The ADA recommends the following therapeutic goal for glycemic control related to Hgb A1c measurement: Goal of therapy: <6.5 Hgb A1c  Reference: American Diabetes Association: Clinical Practice Recommendations 2010, Diabetes Care, 2010, 33: (Suppl  1).     Lab Results  Component Value Date   ALBUMIN 3.0 (L) 07/13/2007   ALBUMIN 3.2 (L) 07/12/2007   ALBUMIN 3.4 (L)  07/11/2007    No results found for: "MG" No results found for: "VD25OH"  No results found for: "PREALBUMIN"    Latest Ref Rng & Units 05/12/2021    8:13 AM 07/30/2012    9:07 AM 02/14/2009    9:12 PM  CBC EXTENDED  WBC 4.0 - 10.5 K/uL 6.1   6.9   RBC 3.87 - 5.11 MIL/uL 4.88   4.48   Hemoglobin 12.0 - 15.0 g/dL 64.4  03.4  74.2   HCT 36.0 - 46.0 % 42.8   37.3   Platelets 150 - 400 K/uL 274   278   NEUT# 1.7 - 7.7 K/uL   3.9   Lymph# 0.7 - 4.0 K/uL   2.5      There is no height or weight on file to calculate BMI.  Orders:  No orders of the defined types were placed in this encounter.  No orders of the defined types were placed in this encounter.    Procedures: No procedures performed  Clinical Data: No additional findings.  ROS:  All other systems negative, except as noted in the HPI. Review of Systems  Objective: Vital Signs: There were no vitals taken for this visit.  Specialty Comments:  No specialty comments available.  PMFS History: Patient Active Problem List  Diagnosis Date Noted   Posterior tibial tendon dysfunction (PTTD) of right lower extremity    Chronic venous insufficiency 02/23/2019   Varicose veins of both lower extremities 02/23/2019   Bilateral leg pain 02/23/2019   Papilloma of breast 07/07/2012   Cough 02/28/2011   Past Medical History:  Diagnosis Date   Allergic rhinitis    Anxiety    Hypertension    Panic attacks    Seasonal allergies     Family History  Problem Relation Age of Onset   Hypertension Mother    Cancer Mother        skin   Hypertension Father    Cancer Father        skin    Past Surgical History:  Procedure Laterality Date   ANTERIOR FUSION CERVICAL SPINE     BREAST LUMPECTOMY WITH NEEDLE LOCALIZATION Right 07/30/2012   Procedure: RIGHT BREAST WIRE LOCALIZATION LUMPECTOMY ;  Surgeon: Robyne Askew, MD;  Location: [facility];  Service: General;  Laterality: Right;   BREAST SURGERY      CHOLECYSTECTOMY  07/09/2007   lap choli   DIAGNOSTIC LAPAROSCOPY  01/09/2008   ectopic preg rt   FOOT ARTHRODESIS Right 05/12/2021   Procedure: RIGHT SUBTALAR AND TALONAVICULAR FUSION;  Surgeon: Nadara Mustard, MD;  Location: [facility] OR;  Service: Orthopedics;  Laterality: Right;   Social History   Occupational History   Not on file  Tobacco Use   Smoking status: Never   Smokeless tobacco: Never  Vaping Use   Vaping status: Never Used  Substance and Sexual Activity   Alcohol use: No   Drug use: No   Sexual activity: Not on file

## 2023-01-30 ENCOUNTER — Telehealth: Payer: Self-pay | Admitting: *Deleted

## 2023-01-30 NOTE — Telephone Encounter (Signed)
Spoke with the patient regarding the referral to GYN oncology. Patient scheduled as new patient with Dr Alvester Morin 2/3 at 9:45 am. Patient given an arrival time of 9:15 am.  Explained to the patient the the doctor will perform a pelvic exam at this visit. Patient given the policy that only one visitor allowed and that visitor must be over 16 yrs are allowed in the Cancer Center. Patient given the address/phone number for the clinic and that the center offers free valet service. Patient aware that masks required.

## 2023-02-07 ENCOUNTER — Encounter: Payer: Self-pay | Admitting: Psychiatry

## 2023-02-11 ENCOUNTER — Inpatient Hospital Stay: Payer: Managed Care, Other (non HMO) | Attending: Psychiatry | Admitting: Psychiatry

## 2023-02-11 ENCOUNTER — Encounter: Payer: Self-pay | Admitting: Psychiatry

## 2023-02-11 ENCOUNTER — Inpatient Hospital Stay: Payer: Managed Care, Other (non HMO) | Admitting: Gynecologic Oncology

## 2023-02-11 VITALS — BP 121/68 | HR 74 | Temp 98.2°F | Resp 16 | Ht 66.0 in | Wt 269.0 lb

## 2023-02-11 DIAGNOSIS — F410 Panic disorder [episodic paroxysmal anxiety] without agoraphobia: Secondary | ICD-10-CM | POA: Insufficient documentation

## 2023-02-11 DIAGNOSIS — C541 Malignant neoplasm of endometrium: Secondary | ICD-10-CM | POA: Diagnosis not present

## 2023-02-11 DIAGNOSIS — F419 Anxiety disorder, unspecified: Secondary | ICD-10-CM | POA: Insufficient documentation

## 2023-02-11 DIAGNOSIS — Z6841 Body Mass Index (BMI) 40.0 and over, adult: Secondary | ICD-10-CM

## 2023-02-11 DIAGNOSIS — N950 Postmenopausal bleeding: Secondary | ICD-10-CM | POA: Insufficient documentation

## 2023-02-11 DIAGNOSIS — J309 Allergic rhinitis, unspecified: Secondary | ICD-10-CM | POA: Diagnosis not present

## 2023-02-11 DIAGNOSIS — Z803 Family history of malignant neoplasm of breast: Secondary | ICD-10-CM | POA: Diagnosis not present

## 2023-02-11 DIAGNOSIS — I10 Essential (primary) hypertension: Secondary | ICD-10-CM | POA: Diagnosis not present

## 2023-02-11 DIAGNOSIS — Z808 Family history of malignant neoplasm of other organs or systems: Secondary | ICD-10-CM | POA: Insufficient documentation

## 2023-02-11 DIAGNOSIS — Z79899 Other long term (current) drug therapy: Secondary | ICD-10-CM | POA: Insufficient documentation

## 2023-02-11 DIAGNOSIS — Z801 Family history of malignant neoplasm of trachea, bronchus and lung: Secondary | ICD-10-CM | POA: Diagnosis not present

## 2023-02-11 MED ORDER — SENNOSIDES-DOCUSATE SODIUM 8.6-50 MG PO TABS: 2.0000 | ORAL_TABLET | Freq: Every day | ORAL | 0 refills | Status: AC

## 2023-02-11 MED ORDER — TRAMADOL HCL 50 MG PO TABS: 50.0000 mg | ORAL_TABLET | Freq: Four times a day (QID) | ORAL | 0 refills | Status: DC | PRN

## 2023-02-11 NOTE — Patient Instructions (Signed)
SURGICAL WAITING ROOM VISITATION  Patients having surgery or a procedure may have no more than 2 support people in the waiting area - these visitors may rotate.    Children under the age of 29 must have an adult with them who is not the patient.  Due to an increase in RSV and influenza rates and associated hospitalizations, children ages 27 and under may not visit patients in [facility] hospitals.  Visitors with respiratory illnesses are discouraged from visiting and should remain at home.  If the patient needs to stay at the hospital during part of their recovery, the visitor guidelines for inpatient rooms apply. Pre-op nurse will coordinate an appropriate time for 1 support person to accompany patient in pre-op.  This support person may not rotate.    Please refer to the [facility] website for the visitor guidelines for Inpatients (after your surgery is over and you are in a regular room).       Your procedure is scheduled on:    Report to [facility] Main Entrance    Report to admitting at AM   Call this number if you have problems the morning of surgery [phone]   Do not eat food :After Midnight.   After Midnight you may have the following liquids until ______ AM/ PM DAY OF SURGERY  Water Non-Citrus Juices (without pulp, NO RED-Apple, White grape, White cranberry) Black Coffee (NO MILK/CREAM OR CREAMERS, sugar ok)  Clear Tea (NO MILK/CREAM OR CREAMERS, sugar ok) regular and decaf                             Plain Jell-O (NO RED)                                           Fruit ices (not with fruit pulp, NO RED)                                     Popsicles (NO RED)                                                               Sports drinks like Gatorade (NO RED)              Drink 2 Ensure/G2 drinks AT 10:00 PM the night before surgery.        The day of surgery:  Drink ONE (1) Pre-Surgery Clear Ensure or G2 at AM the morning of surgery. Drink in one  sitting. Do not sip.  This drink was given to you during your hospital  pre-op appointment visit. Nothing else to drink after completing the  Pre-Surgery Clear Ensure or G2.          If you have questions, please contact your surgeon's office.   FOLLOW BOWEL PREP AND ANY ADDITIONAL PRE OP INSTRUCTIONS YOU RECEIVED FROM YOUR SURGEON'S OFFICE!!!     Oral Hygiene is also important to reduce your risk of infection.  Remember - BRUSH YOUR TEETH THE MORNING OF SURGERY WITH YOUR REGULAR TOOTHPASTE  DENTURES WILL BE REMOVED PRIOR TO SURGERY PLEASE DO NOT APPLY "Poly grip" OR ADHESIVES!!!   Do NOT smoke after Midnight   Stop all vitamins and herbal supplements 7 days before surgery.   Take these medicines the morning of surgery with A SIP OF WATER:   DO NOT TAKE ANY ORAL DIABETIC MEDICATIONS DAY OF YOUR SURGERY  Bring CPAP mask and tubing day of surgery.                              You may not have any metal on your body including hair pins, jewelry, and body piercing             Do not wear make-up, lotions, powders, perfumes/cologne, or deodorant  Do not wear nail polish including gel and S&S, artificial/acrylic nails, or any other type of covering on natural nails including finger and toenails. If you have artificial nails, gel coating, etc. that needs to be removed by a nail salon please have this removed prior to surgery or surgery may need to be canceled/ delayed if the surgeon/ anesthesia feels like they are unable to be safely monitored.   Do not shave  48 hours prior to surgery.               Men may shave face and neck.   Do not bring valuables to the hospital. [facility] IS NOT             RESPONSIBLE   FOR VALUABLES.   Contacts, glasses, dentures or bridgework may not be worn into surgery.   Bring small overnight bag day of surgery.   DO NOT BRING YOUR HOME MEDICATIONS TO THE HOSPITAL. PHARMACY WILL DISPENSE MEDICATIONS LISTED ON  YOUR MEDICATION LIST TO YOU DURING YOUR ADMISSION IN THE HOSPITAL!    Patients discharged on the day of surgery will not be allowed to drive home.  Someone NEEDS to stay with you for the first 24 hours after anesthesia.   Special Instructions: Bring a copy of your healthcare power of attorney and living will documents the day of surgery if you haven't scanned them before.              Please read over the following fact sheets you were given: IF YOU HAVE QUESTIONS ABOUT YOUR PRE-OP INSTRUCTIONS PLEASE CALL [phone]   If you received a COVID test during your pre-op visit  it is requested that you wear a mask when out in public, stay away from anyone that may not be feeling well and notify your surgeon if you develop symptoms. If you test positive for Covid or have been in contact with anyone that has tested positive in the last 10 days please notify you surgeon.    [facility] - Preparing for Surgery Before surgery, you can play an important role.  Because skin is not sterile, your skin needs to be as free of germs as possible.  You can reduce the number of germs on your skin by washing with CHG (chlorahexidine gluconate) soap before surgery.  CHG is an antiseptic cleaner which kills germs and bonds with the skin to continue killing germs even after washing. Please DO NOT use if you have an allergy to CHG or antibacterial soaps.  If your skin becomes reddened/irritated stop using the CHG and inform your nurse when you arrive at  Short Stay. Do not shave (including legs and underarms) for at least 48 hours prior to the first CHG shower.  You may shave your face/neck. Please follow these instructions carefully:  1.  Shower with CHG Soap the night before surgery and the  morning of Surgery.  2.  If you choose to wash your hair, wash your hair first as usual with your  normal  shampoo.  3.  After you shampoo, rinse your hair and body thoroughly to remove the  shampoo.                           4.   Use CHG as you would any other liquid soap.  You can apply chg directly  to the skin and wash                       Gently with a scrungie or clean washcloth.  5.  Apply the CHG Soap to your body ONLY FROM THE NECK DOWN.   Do not use on face/ open                           Wound or open sores. Avoid contact with eyes, ears mouth and genitals (private parts).                       Wash face,  Genitals (private parts) with your normal soap.             6.  Wash thoroughly, paying special attention to the area where your surgery  will be performed.  7.  Thoroughly rinse your body with warm water from the neck down.  8.  DO NOT shower/wash with your normal soap after using and rinsing off  the CHG Soap.                9.  Pat yourself dry with a clean towel.            10.  Wear clean pajamas.            11.  Place clean sheets on your bed the night of your first shower and do not  sleep with pets. Day of Surgery : Do not apply any lotions/deodorants the morning of surgery.  Please wear clean clothes to the hospital/surgery center.  FAILURE TO FOLLOW THESE INSTRUCTIONS MAY RESULT IN THE CANCELLATION OF YOUR SURGERY PATIENT SIGNATURE_________________________________  NURSE SIGNATURE__________________________________  ________________________________________________________________________

## 2023-02-11 NOTE — Progress Notes (Addendum)
 Anesthesia Review:  ERE:Mpryjmi Tisovec  Cardiologist : none  Chest x-ray : EKG : 02/13/23  Echo : Stress test: Cardiac Cath :  Activity level: can do a flight of stairs without difficulty  Sleep Study/ CPAP : none  Fasting Blood Sugar :      / Checks Blood Sugar -- times a day:   Blood Thinner/ Instructions /Last Dose: ASA / Instructions/ Last Dose :    PT diagnosed with Covid on 01/25/23 with DR Tisovec.  CXR was done per pt and it was clear p[er pt   Still has remaining slight cough.  PT states DR Eldonna is aware.  Called DR Tisoved office at [phone] and requested ov note from covid diagnosis, covid test rest result and cxr result.  [facility] [employer] Tab on 02/13/23 is OV note from Corean Celestia PIETY dated 01/25/23 for Covid diagnosis and CXR done 01/25/23.

## 2023-02-11 NOTE — Progress Notes (Unsigned)
GYNECOLOGIC ONCOLOGY NEW PATIENT CONSULTATION  Date of Service: 02/11/2023 Referring Provider: Jule Economy, MD   ASSESSMENT AND PLAN: [name] is a 58 y.o. woman with FIGO grade 1 endometrioid endometrial cancer.  We reviewed the nature of endometrial cancer and its recommended surgical staging, including total hysterectomy, bilateral salpingo-oophorectomy, and lymph node assessment. The patient is a suitable candidate for staging via a minimally invasive approach to surgery.  We reviewed that robotic assistance would be used to complete the surgery. We discussed that most endometrial cancer is detected early and that decisions regarding adjuvant therapy will be made based on her final pathology.   We reviewed the sentinel lymph node technique. Risks and benefits of sentinel lymph node biopsy was reviewed. We reviewed the technique and ICG dye. The patient DOES NOT have an iodine allergy or known liver dysfunction. We reviewed the false negative rate (0.4%), and that 3% of patients with metastatic disease will not have it detected by SLN biopsy in endometrial cancer. A low risk of allergic reaction to the dye, <0.2% for ICG, has been reported. We also discussed that in the case of failed mapping, which occurs 40% of the time, a bilateral or unilateral lymphadenectomy will be performed at the surgeon's discretion.   Potential benefits of sentinel nodes including a higher detection rate for metastasis due to ultrastaging and potential reduction in operative morbidity. However, there remains uncertainty as to the role for treatment of micrometastatic disease. Further, the benefit of operative morbidity associated with the SLN technique in endometrial cancer is not yet completely known. In other patient populations (e.g. the cervical cancer population) there has been observed reductions in morbidity with SLN biopsy compared to pelvic lymphadenectomy. Lymphedema, nerve dysfunction and lymphocysts are all  potential risks with the SLN technique as with complete lymphadenectomy. Additional risks to the patient include the risk of damage to an internal organ while operating in an altered view (e.g. the black and white image of the robotic fluorescence imaging mode).   Patient was consented for: Robotic assisted total laparoscopic hysterectomy, bilateral salpingo-oophorectomy, sentinel lymph node evaluation and biopsy, possible lymph node dissection on 02/26/23.  We did review that if patient is unable to tolerate Trendelenburg, we would consider dilation and curettage with intrauterine device placement, but I suspect we will be able to proceed with hysterectomy as planned.  The risks of surgery were discussed in detail and she understands these to including but not limited to bleeding requiring a blood transfusion, infection, injury to adjacent organs (including but not limited to the bowels, bladder, ureters, nerves, blood vessels), thromboembolic events, wound separation, hernia, vaginal cuff separation, possible risk of lymphedema and lymphocyst if lymphadenectomy performed, unforseen complication, and possible need for re-exploration.  If the patient experiences any of these events, she understands that her hospitalization or recovery may be prolonged and that she may need to take additional medications for a prolonged period. The patient will receive DVT and antibiotic prophylaxis as indicated. She voiced a clear understanding. She had the opportunity to ask questions and informed consent was obtained today. She wishes to proceed.  She does not require preoperative clearance. Her METs are >4.  All preoperative instructions were reviewed. Postoperative expectations were also reviewed. Written handouts were provided to the patient.  RTC postop  A copy of this note was sent to the patient's referring provider.  Clide Cliff, MD Gynecologic Oncology   Medical Decision Making I personally spent   TOTAL 45 minutes face-to-face and non-face-to-face in the  care of this patient, which includes all pre, intra, and post visit time on the date of service.   ------------  CC: Endometrial cancer  HISTORY OF PRESENT ILLNESS:  [name] is a 58 y.o. woman who is seen in consultation at the request of Jule Economy, MD for evaluation of new diagnosis of endometrial cancer.  Patient presented to her OB/GYN for postmenopausal bleeding, intermittent spotting. On 01/22/2023 she underwent a pelvic ultrasound which showed an endometrial stripe of 13.1 mm.  She also underwent an endometrial biopsy the same day which returned with FIGO grade 1 endometrioid adenocarcinoma.  Today patient presents with her mom.  She reports that her bleeding started about 6 months ago.  She continues to have some intermittent spotting.  She denies abdominal bloating, early satiety, significant weight loss, change in bowel or bladder habits.   She works as a Clinical research associate at [employer].    PAST MEDICAL HISTORY: Past Medical History:  Diagnosis Date   Allergic rhinitis    Anxiety    Hypertension    Panic attacks    Seasonal allergies     PAST SURGICAL HISTORY: Past Surgical History:  Procedure Laterality Date   ANTERIOR FUSION CERVICAL SPINE  2015   BREAST LUMPECTOMY WITH NEEDLE LOCALIZATION Right 07/30/2012   Procedure: RIGHT BREAST WIRE LOCALIZATION LUMPECTOMY ;  Surgeon: Robyne Askew, MD;  Location: [facility];  Service: General;  Laterality: Right;   BREAST SURGERY     CHOLECYSTECTOMY  07/09/2007   lap choli   DIAGNOSTIC LAPAROSCOPY Right 05/25/2008   ectopic preg, partial right salpingectomy   FOOT ARTHRODESIS Right 05/12/2021   Procedure: RIGHT SUBTALAR AND TALONAVICULAR FUSION;  Surgeon: Nadara Mustard, MD;  [facility];  Service: Orthopedics;  Laterality: Right;    OB/GYN HISTORY: OB History  Gravida Para Term Preterm AB Living  4    4   SAB IAB Ectopic Multiple Live  Births  3  1      # Outcome Date GA Lbr Len/2nd Weight Sex Type Anes PTL Lv  4 Ectopic           3 SAB           2 SAB           1 SAB               Age at menarche: 21 Age at menopause: 74 Hx of HRT: no Hx of STI: no Last pap: 09/21/20 wnl History of abnormal pap smears: reports one abnormal about 10 years ago, repeat normal; denies biopsies or excisional procedures  SCREENING STUDIES:  Last mammogram: 12/2022 Last colonoscopy: 2015 or 2017, 10 year follow-up  MEDICATIONS:  Current Outpatient Medications:    Acetaminophen 500 MG capsule, Take 1,000 mg by mouth every 6 (six) hours as needed for pain., Disp: , Rfl:    ALPRAZolam (XANAX) 0.25 MG tablet, Take 0.25 mg by mouth daily as needed for anxiety., Disp: , Rfl:    amLODipine-olmesartan (AZOR) 5-40 MG tablet, Take 1 tablet by mouth at bedtime., Disp: , Rfl:    benzonatate (TESSALON) 200 MG capsule, Take 1 capsule by mouth Three times daily as needed., Disp: , Rfl:    diclofenac Sodium (VOLTAREN) 1 % GEL, Apply 2 g topically daily. Foot, Disp: , Rfl:    hydrochlorothiazide (HYDRODIURIL) 25 MG tablet, Take 25 mg by mouth at bedtime., Disp: , Rfl:    ibuprofen (ADVIL,MOTRIN) 200 MG tablet, Take 400 mg by  mouth every 6 (six) hours as needed for mild pain or moderate pain., Disp: , Rfl:    omeprazole (PRILOSEC) 20 MG capsule, Take 20 mg by mouth daily as needed (heartburn)., Disp: , Rfl:    senna-docusate (SENOKOT-S) 8.6-50 MG tablet, Take 2 tablets by mouth at bedtime. For AFTER surgery, do not take if having diarrhea, Disp: 30 tablet, Rfl: 0   traMADol (ULTRAM) 50 MG tablet, Take 1 tablet (50 mg total) by mouth every 6 (six) hours as needed for severe pain (pain score 7-10). For AFTER surgery only, do not take and drive, Disp: 10 tablet, Rfl: 0  ALLERGIES: Allergies  Allergen Reactions   Hydrocodone Anaphylaxis    Couldn't move arms and couldn't walk   Avelox [Moxifloxacin Hcl In Nacl]     Nervous    Codeine      Nervous/anxious     FAMILY HISTORY: Family History  Problem Relation Age of Onset   Hypertension Mother    Basal cell carcinoma Mother    Hypertension Father    Melanoma Father    Breast cancer Maternal Aunt    Breast cancer Cousin        maternal   Lung cancer Maternal Uncle    Melanoma Maternal Uncle    Colon cancer Neg Hx    Pancreatic cancer Neg Hx    Prostate cancer Neg Hx    Ovarian cancer Neg Hx    Endometrial cancer Neg Hx     SOCIAL HISTORY: Social History   Socioeconomic History   Marital status: Married    Spouse name: Not on file   Number of children: Not on file   Years of education: Not on file   Highest education level: Not on file  Occupational History   Not on file  Tobacco Use   Smoking status: Never   Smokeless tobacco: Never  Vaping Use   Vaping status: Never Used  Substance and Sexual Activity   Alcohol use: No   Drug use: No   Sexual activity: Not Currently  Other Topics Concern   Not on file  Social History Narrative   Not on file   Social Drivers of Health   Financial Resource Strain: Not on file  Food Insecurity: No Food Insecurity (02/07/2023)   Hunger Vital Sign    Worried About Running Out of Food in the Last Year: Never true    Ran Out of Food in the Last Year: Never true  Transportation Needs: No Transportation Needs (02/07/2023)   PRAPARE - Administrator, Civil Service (Medical): No    Lack of Transportation (Non-Medical): No  Physical Activity: Not on file  Stress: Not on file  Social Connections: Not on file  Intimate Partner Violence: Not At Risk (02/07/2023)   Humiliation, Afraid, Rape, and Kick questionnaire    Fear of Current or Ex-Partner: No    Emotionally Abused: No    Physically Abused: No    Sexually Abused: No    REVIEW OF SYSTEMS: New patient intake form was reviewed.  Complete 10-system review is negative except for the following: Headache, vaginal spotting, left groin pain  PHYSICAL  EXAM: BP 121/68 (BP Location: Right Arm, Patient Position: Sitting)   Pulse 74   Temp 98.2 F (36.8 C) (Oral)   Resp 16   Ht 5\' 6"  (1.676 m)   Wt 269 lb (122 kg)   SpO2 100%   BMI 43.42 kg/m  Constitutional: No acute distress. Neuro/Psych: Alert, oriented.  Head  and Neck: Normocephalic, atraumatic. Neck symmetric without masses. Sclera anicteric.  Respiratory: Normal work of breathing. Clear to auscultation bilaterally. Cardiovascular: Regular rate and rhythm, no murmurs, rubs, or gallops. Abdomen: Normoactive bowel sounds. Soft, non-distended, non-tender to palpation. Well healed periumbilical laparoscopic incision Extremities: Grossly normal range of motion. Warm, well perfused. No edema bilaterally. Skin: No rashes or lesions. Lymphatic: No cervical, supraclavicular, or inguinal adenopathy. Genitourinary: External genitalia without lesions. Urethral meatus without lesions or prolapse. On speculum exam, vagina and cervix without lesions, difficult to visualize the posterior cervix due to body habitus. Bimanual exam reveals normal cervix and mobile uterus, exam limited by body habitus. Exam chaperoned by Warner Mccreedy, NP   LABORATORY AND RADIOLOGIC DATA: Outside medical records were reviewed to synthesize the above history, along with the history and physical obtained during the visit.  Outside laboratory, pathology, and imaging reports were reviewed, with pertinent results below.    WBC  Date Value Ref Range Status  05/12/2021 6.1 4.0 - 10.5 K/uL Final   Hemoglobin  Date Value Ref Range Status  05/12/2021 14.4 12.0 - 15.0 g/dL Final   HCT  Date Value Ref Range Status  05/12/2021 42.8 36.0 - 46.0 % Final   Platelets  Date Value Ref Range Status  05/12/2021 274 150 - 400 K/uL Final   Creatinine, Ser  Date Value Ref Range Status  05/12/2021 0.79 0.44 - 1.00 mg/dL Final   AST  Date Value Ref Range Status  07/13/2007 20  Final   ALT  Date Value Ref Range Status   07/13/2007 21  Final   Pelvic ultrasound (01/22/23): Uterus with submucosal fibroid = 18 x 13 mm Endometrial thickened = 13 mm Ovaries within normal limits No adnexal masses or free fluid seen  Surgical pathology (01/22/23): Endometrium, biopsy -endometrioid adenocarcinoma, FIGO grade 1, with foci of squamous differentiation

## 2023-02-11 NOTE — Patient Instructions (Signed)
Preparing for your Surgery  Plan for surgery on February 26, 2023 with Dr. Clide Cliff at [facility]. You will be scheduled for robotic assisted total laparoscopic hysterectomy (removal of the uterus and cervix), bilateral salpingo-oophorectomy (removal of both ovaries and fallopian tubes), sentinel lymph node biopsy, possible lymph node dissection, possible laparotomy (larger incision on your abdomen if needed).   Pre-operative Testing -You will receive a phone call from presurgical testing at [facility] to arrange for a pre-operative appointment and lab work.  -Bring your insurance card, copy of an advanced directive if applicable, medication list  -At that visit, you will be asked to sign a consent for a possible blood transfusion in case a transfusion becomes necessary during surgery.  The need for a blood transfusion is rare but having consent is a necessary part of your care.     -You should not be taking blood thinners or aspirin at least ten days prior to surgery unless instructed by your surgeon.  -Do not take supplements such as fish oil (omega 3), red yeast rice, turmeric before your surgery. STOP TAKING AT LEAST 10 DAYS BEFORE SURGERY. You want to avoid medications with aspirin in them including headache powders such as BC or Goody's), Excedrin migraine.  Day Before Surgery at Home -You will be asked to take in a light diet the day before surgery. You will be advised you can have clear liquids up until 3 hours before your surgery.    Eat a light diet the day before surgery.  Examples including soups, broths, toast, yogurt, mashed potatoes.  AVOID GAS PRODUCING FOODS AND BEVERAGES. Things to avoid include carbonated beverages (fizzy beverages, sodas), raw fruits and raw vegetables (uncooked), or beans.   If your bowels are filled with gas, your surgeon will have difficulty visualizing your pelvic organs which increases your surgical risks.  Your role in  recovery Your role is to become active as soon as directed by your doctor, while still giving yourself time to heal.  Rest when you feel tired. You will be asked to do the following in order to speed your recovery:  - Cough and breathe deeply. This helps to clear and expand your lungs and can prevent pneumonia after surgery.  - STAY ACTIVE WHEN YOU GET HOME. Do mild physical activity. Walking or moving your legs help your circulation and body functions return to normal. Do not try to get up or walk alone the first time after surgery.   -If you develop swelling on one leg or the other, pain in the back of your leg, redness/warmth in one of your legs, please call the office or go to the Emergency Room to have a doppler to rule out a blood clot. For shortness of breath, chest pain-seek care in the Emergency Room as soon as possible. - Actively manage your pain. Managing your pain lets you move in comfort. We will ask you to rate your pain on a scale of zero to 10. It is your responsibility to tell your doctor or nurse where and how much you hurt so your pain can be treated.  Special Considerations -If you are diabetic, you may be placed on insulin after surgery to have closer control over your blood sugars to promote healing and recovery.  This does not mean that you will be discharged on insulin.  If applicable, your oral antidiabetics will be resumed when you are tolerating a solid diet.  -Your final pathology results from surgery should be  available around one week after surgery and the results will be relayed to you when available.  -Dr. Antionette Char is the surgeon that assists your GYN Oncologist with surgery.  If you end up staying the night, the next day after your surgery you will either see Dr. Pricilla Holm, Dr. Alvester Morin, or Dr. Antionette Char.  -FMLA forms can be faxed to [phone] and please allow 5-7 business days for completion.  Pain Management After Surgery -You will be prescribed  your pain medication and bowel regimen medications before surgery so that you can have these available when you are discharged from the hospital. The pain medication is for use ONLY AFTER surgery and a new prescription will not be given.   -Make sure that you have Tylenol and Ibuprofen IF YOU ARE ABLE TO TAKE THESE MEDICATIONS at home to use on a regular basis after surgery for pain control. We recommend alternating the medications every hour to six hours since they work differently and are processed in the body differently for pain relief.  -Review the attached handout on narcotic use and their risks and side effects.   Bowel Regimen -You will be prescribed Sennakot-S to take nightly to prevent constipation especially if you are taking the narcotic pain medication intermittently.  It is important to prevent constipation and drink adequate amounts of liquids. You can stop taking this medication when you are not taking pain medication and you are back on your normal bowel routine.  Risks of Surgery Risks of surgery are low but include bleeding, infection, damage to surrounding structures, re-operation, blood clots, and very rarely death.   Blood Transfusion Information (For the consent to be signed before surgery)  We will be checking your blood type before surgery so in case of emergencies, we will know what type of blood you would need.                                            WHAT IS A BLOOD TRANSFUSION?  A transfusion is the replacement of blood or some of its parts. Blood is made up of multiple cells which provide different functions. Red blood cells carry oxygen and are used for blood loss replacement. White blood cells fight against infection. Platelets control bleeding. Plasma helps clot blood. Other blood products are available for specialized needs, such as hemophilia or other clotting disorders. BEFORE THE TRANSFUSION  Who gives blood for transfusions?  You may be able to  donate blood to be used at a later date on yourself (autologous donation). Relatives can be asked to donate blood. This is generally not any safer than if you have received blood from a stranger. The same precautions are taken to ensure safety when a relative's blood is donated. Healthy volunteers who are fully evaluated to make sure their blood is safe. This is blood bank blood. Transfusion therapy is the safest it has ever been in the practice of medicine. Before blood is taken from a donor, a complete history is taken to make sure that person has no history of diseases nor engages in risky social behavior (examples are intravenous drug use or sexual activity with multiple partners). The donor's travel history is screened to minimize risk of transmitting infections, such as malaria. The donated blood is tested for signs of infectious diseases, such as HIV and hepatitis. The blood is then tested to be sure it  is compatible with you in order to minimize the chance of a transfusion reaction. If you or a relative donates blood, this is often done in anticipation of surgery and is not appropriate for emergency situations. It takes many days to process the donated blood. RISKS AND COMPLICATIONS Although transfusion therapy is very safe and saves many lives, the main dangers of transfusion include:  Getting an infectious disease. Developing a transfusion reaction. This is an allergic reaction to something in the blood you were given. Every precaution is taken to prevent this. The decision to have a blood transfusion has been considered carefully by your caregiver before blood is given. Blood is not given unless the benefits outweigh the risks.  AFTER SURGERY INSTRUCTIONS  Return to work: 4-6 weeks if applicable  Activity: 1. Be up and out of the bed during the day.  Take a nap if needed.  You may walk up steps but be careful and use the hand rail.  Stair climbing will tire you more than you think, you may  need to stop part way and rest.   2. No lifting or straining for 6 weeks over 10 pounds. No pushing, pulling, straining for 6 weeks.  3. No driving for 4-09 days when the following criteria have been met: Do not drive if you are taking narcotic pain medicine and make sure that your reaction time has returned.   4. You can shower as soon as the next day after surgery. Shower daily.  Use your regular soap and water (not directly on the incision) and pat your incision(s) dry afterwards; don't rub.  No tub baths or submerging your body in water until cleared by your surgeon. If you have the soap that was given to you by pre-surgical testing that was used before surgery, you do not need to use it afterwards because this can irritate your incisions.   5. No sexual activity and nothing in the vagina for 12 weeks.  6. You may experience a small amount of clear drainage from your incisions, which is normal.  If the drainage persists, increases, or changes color please call the office.  7. Do not use creams, lotions, or ointments such as neosporin on your incisions after surgery until advised by your surgeon because they can cause removal of the dermabond glue on your incisions.    8. You may experience vaginal spotting after surgery or when the stitches at the top of the vagina begin to dissolve.  The spotting is normal but if you experience heavy bleeding, call our office.  9. Take Tylenol or ibuprofen first for pain if you are able to take these medications and only use narcotic pain medication for severe pain not relieved by the Tylenol or Ibuprofen.  Monitor your Tylenol intake to a max of 4,000 mg in a 24 hour period. You can alternate these medications after surgery.  Diet: 1. Low sodium Heart Healthy Diet is recommended but you are cleared to resume your normal (before surgery) diet after your procedure.  2. It is safe to use a laxative, such as Miralax or Colace, if you have difficulty moving  your bowels before surgery. You have been prescribed Sennakot-S to take at bedtime every evening after surgery to keep bowel movements regular and to prevent constipation.    Wound Care: 1. Keep clean and dry.  Shower daily.  Reasons to call the Doctor: Fever - Oral temperature greater than 100.4 degrees Fahrenheit Foul-smelling vaginal discharge Difficulty urinating Nausea and vomiting  Increased pain at the site of the incision that is unrelieved with pain medicine. Difficulty breathing with or without chest pain New calf pain especially if only on one side Sudden, continuing increased vaginal bleeding with or without clots.   Contacts: For questions or concerns you should contact:  Dr. Clide Cliff at [phone]  Warner Mccreedy, NP at [phone]  After Hours: call [phone] and have the GYN Oncologist paged/contacted (after 5 pm or on the weekends). You will speak with an after hours RN and let he or she know you have had surgery.  Messages sent via mychart are for non-urgent matters and are not responded to after hours so for urgent needs, please call the after hours number.

## 2023-02-11 NOTE — H&P (View-Only) (Signed)
 GYNECOLOGIC ONCOLOGY NEW PATIENT CONSULTATION  Date of Service: 02/11/2023 Referring Provider: Jule Economy, MD   ASSESSMENT AND PLAN: [name] is a 57 y.o. woman with FIGO grade 1 endometrioid endometrial cancer.  We reviewed the nature of endometrial cancer and its recommended surgical staging, including total hysterectomy, bilateral salpingo-oophorectomy, and lymph node assessment. The patient is a suitable candidate for staging via a minimally invasive approach to surgery.  We reviewed that robotic assistance would be used to complete the surgery. We discussed that most endometrial cancer is detected early and that decisions regarding adjuvant therapy will be made based on her final pathology.   We reviewed the sentinel lymph node technique. Risks and benefits of sentinel lymph node biopsy was reviewed. We reviewed the technique and ICG dye. The patient DOES NOT have an iodine allergy or known liver dysfunction. We reviewed the false negative rate (0.4%), and that 3% of patients with metastatic disease will not have it detected by SLN biopsy in endometrial cancer. A low risk of allergic reaction to the dye, <0.2% for ICG, has been reported. We also discussed that in the case of failed mapping, which occurs 40% of the time, a bilateral or unilateral lymphadenectomy will be performed at the surgeon's discretion.   Potential benefits of sentinel nodes including a higher detection rate for metastasis due to ultrastaging and potential reduction in operative morbidity. However, there remains uncertainty as to the role for treatment of micrometastatic disease. Further, the benefit of operative morbidity associated with the SLN technique in endometrial cancer is not yet completely known. In other patient populations (e.g. the cervical cancer population) there has been observed reductions in morbidity with SLN biopsy compared to pelvic lymphadenectomy. Lymphedema, nerve dysfunction and lymphocysts are all  potential risks with the SLN technique as with complete lymphadenectomy. Additional risks to the patient include the risk of damage to an internal organ while operating in an altered view (e.g. the black and white image of the robotic fluorescence imaging mode).   Patient was consented for: Robotic assisted total laparoscopic hysterectomy, bilateral salpingo-oophorectomy, sentinel lymph node evaluation and biopsy, possible lymph node dissection on 02/26/23.  We did review that if patient is unable to tolerate Trendelenburg, we would consider dilation and curettage with intrauterine device placement, but I suspect we will be able to proceed with hysterectomy as planned.  The risks of surgery were discussed in detail and she understands these to including but not limited to bleeding requiring a blood transfusion, infection, injury to adjacent organs (including but not limited to the bowels, bladder, ureters, nerves, blood vessels), thromboembolic events, wound separation, hernia, vaginal cuff separation, possible risk of lymphedema and lymphocyst if lymphadenectomy performed, unforseen complication, and possible need for re-exploration.  If the patient experiences any of these events, she understands that her hospitalization or recovery may be prolonged and that she may need to take additional medications for a prolonged period. The patient will receive DVT and antibiotic prophylaxis as indicated. She voiced a clear understanding. She had the opportunity to ask questions and informed consent was obtained today. She wishes to proceed.  She does not require preoperative clearance. Her METs are >4.  All preoperative instructions were reviewed. Postoperative expectations were also reviewed. Written handouts were provided to the patient.  RTC postop  A copy of this note was sent to the patient's referring provider.  Clide Cliff, MD Gynecologic Oncology   Medical Decision Making I personally spent   TOTAL 45 minutes face-to-face and non-face-to-face in the  care of this patient, which includes all pre, intra, and post visit time on the date of service.   ------------  CC: Endometrial cancer  HISTORY OF PRESENT ILLNESS:  [name] is a 58 y.o. woman who is seen in consultation at the request of Jule Economy, MD for evaluation of new diagnosis of endometrial cancer.  Patient presented to her OB/GYN for postmenopausal bleeding, intermittent spotting. On 01/22/2023 she underwent a pelvic ultrasound which showed an endometrial stripe of 13.1 mm.  She also underwent an endometrial biopsy the same day which returned with FIGO grade 1 endometrioid adenocarcinoma.  Today patient presents with her mom.  She reports that her bleeding started about 6 months ago.  She continues to have some intermittent spotting.  She denies abdominal bloating, early satiety, significant weight loss, change in bowel or bladder habits.   She works as a Clinical research associate at [employer].    PAST MEDICAL HISTORY: Past Medical History:  Diagnosis Date   Allergic rhinitis    Anxiety    Hypertension    Panic attacks    Seasonal allergies     PAST SURGICAL HISTORY: Past Surgical History:  Procedure Laterality Date   ANTERIOR FUSION CERVICAL SPINE  2015   BREAST LUMPECTOMY WITH NEEDLE LOCALIZATION Right 07/30/2012   Procedure: RIGHT BREAST WIRE LOCALIZATION LUMPECTOMY ;  Surgeon: Robyne Askew, MD;  Location: [facility];  Service: General;  Laterality: Right;   BREAST SURGERY     CHOLECYSTECTOMY  07/09/2007   lap choli   DIAGNOSTIC LAPAROSCOPY Right 05/25/2008   ectopic preg, partial right salpingectomy   FOOT ARTHRODESIS Right 05/12/2021   Procedure: RIGHT SUBTALAR AND TALONAVICULAR FUSION;  Surgeon: Nadara Mustard, MD;  Location: [facility] OR;  Service: Orthopedics;  Laterality: Right;    OB/GYN HISTORY: OB History  Gravida Para Term Preterm AB Living  4    4   SAB IAB Ectopic Multiple Live  Births  3  1      # Outcome Date GA Lbr Len/2nd Weight Sex Type Anes PTL Lv  4 Ectopic           3 SAB           2 SAB           1 SAB               Age at menarche: 61 Age at menopause: 70 Hx of HRT: no Hx of STI: no Last pap: 09/21/20 wnl History of abnormal pap smears: reports one abnormal about 10 years ago, repeat normal; denies biopsies or excisional procedures  SCREENING STUDIES:  Last mammogram: 12/2022 Last colonoscopy: 2015 or 2017, 10 year follow-up  MEDICATIONS:  Current Outpatient Medications:    Acetaminophen 500 MG capsule, Take 1,000 mg by mouth every 6 (six) hours as needed for pain., Disp: , Rfl:    ALPRAZolam (XANAX) 0.25 MG tablet, Take 0.25 mg by mouth daily as needed for anxiety., Disp: , Rfl:    amLODipine-olmesartan (AZOR) 5-40 MG tablet, Take 1 tablet by mouth at bedtime., Disp: , Rfl:    benzonatate (TESSALON) 200 MG capsule, Take 1 capsule by mouth Three times daily as needed., Disp: , Rfl:    diclofenac Sodium (VOLTAREN) 1 % GEL, Apply 2 g topically daily. Foot, Disp: , Rfl:    hydrochlorothiazide (HYDRODIURIL) 25 MG tablet, Take 25 mg by mouth at bedtime., Disp: , Rfl:    ibuprofen (ADVIL,MOTRIN) 200 MG tablet, Take 400 mg by  mouth every 6 (six) hours as needed for mild pain or moderate pain., Disp: , Rfl:    omeprazole (PRILOSEC) 20 MG capsule, Take 20 mg by mouth daily as needed (heartburn)., Disp: , Rfl:    senna-docusate (SENOKOT-S) 8.6-50 MG tablet, Take 2 tablets by mouth at bedtime. For AFTER surgery, do not take if having diarrhea, Disp: 30 tablet, Rfl: 0   traMADol (ULTRAM) 50 MG tablet, Take 1 tablet (50 mg total) by mouth every 6 (six) hours as needed for severe pain (pain score 7-10). For AFTER surgery only, do not take and drive, Disp: 10 tablet, Rfl: 0  ALLERGIES: Allergies  Allergen Reactions   Hydrocodone Anaphylaxis    Couldn't move arms and couldn't walk   Avelox [Moxifloxacin Hcl In Nacl]     Nervous    Codeine      Nervous/anxious     FAMILY HISTORY: Family History  Problem Relation Age of Onset   Hypertension Mother    Basal cell carcinoma Mother    Hypertension Father    Melanoma Father    Breast cancer Maternal Aunt    Breast cancer Cousin        maternal   Lung cancer Maternal Uncle    Melanoma Maternal Uncle    Colon cancer Neg Hx    Pancreatic cancer Neg Hx    Prostate cancer Neg Hx    Ovarian cancer Neg Hx    Endometrial cancer Neg Hx     SOCIAL HISTORY: Social History   Socioeconomic History   Marital status: Married    Spouse name: Not on file   Number of children: Not on file   Years of education: Not on file   Highest education level: Not on file  Occupational History   Not on file  Tobacco Use   Smoking status: Never   Smokeless tobacco: Never  Vaping Use   Vaping status: Never Used  Substance and Sexual Activity   Alcohol use: No   Drug use: No   Sexual activity: Not Currently  Other Topics Concern   Not on file  Social History Narrative   Not on file   Social Drivers of Health   Financial Resource Strain: Not on file  Food Insecurity: No Food Insecurity (02/07/2023)   Hunger Vital Sign    Worried About Running Out of Food in the Last Year: Never true    Ran Out of Food in the Last Year: Never true  Transportation Needs: No Transportation Needs (02/07/2023)   PRAPARE - Administrator, Civil Service (Medical): No    Lack of Transportation (Non-Medical): No  Physical Activity: Not on file  Stress: Not on file  Social Connections: Not on file  Intimate Partner Violence: Not At Risk (02/07/2023)   Humiliation, Afraid, Rape, and Kick questionnaire    Fear of Current or Ex-Partner: No    Emotionally Abused: No    Physically Abused: No    Sexually Abused: No    REVIEW OF SYSTEMS: New patient intake form was reviewed.  Complete 10-system review is negative except for the following: Headache, vaginal spotting, left groin pain  PHYSICAL  EXAM: BP 121/68 (BP Location: Right Arm, Patient Position: Sitting)   Pulse 74   Temp 98.2 F (36.8 C) (Oral)   Resp 16   Ht 5\' 6"  (1.676 m)   Wt 269 lb (122 kg)   SpO2 100%   BMI 43.42 kg/m  Constitutional: No acute distress. Neuro/Psych: Alert, oriented.  Head  and Neck: Normocephalic, atraumatic. Neck symmetric without masses. Sclera anicteric.  Respiratory: Normal work of breathing. Clear to auscultation bilaterally. Cardiovascular: Regular rate and rhythm, no murmurs, rubs, or gallops. Abdomen: Normoactive bowel sounds. Soft, non-distended, non-tender to palpation. Well healed periumbilical laparoscopic incision Extremities: Grossly normal range of motion. Warm, well perfused. No edema bilaterally. Skin: No rashes or lesions. Lymphatic: No cervical, supraclavicular, or inguinal adenopathy. Genitourinary: External genitalia without lesions. Urethral meatus without lesions or prolapse. On speculum exam, vagina and cervix without lesions, difficult to visualize the posterior cervix due to body habitus. Bimanual exam reveals normal cervix and mobile uterus, exam limited by body habitus. Exam chaperoned by Warner Mccreedy, NP   LABORATORY AND RADIOLOGIC DATA: Outside medical records were reviewed to synthesize the above history, along with the history and physical obtained during the visit.  Outside laboratory, pathology, and imaging reports were reviewed, with pertinent results below.    WBC  Date Value Ref Range Status  05/12/2021 6.1 4.0 - 10.5 K/uL Final   Hemoglobin  Date Value Ref Range Status  05/12/2021 14.4 12.0 - 15.0 g/dL Final   HCT  Date Value Ref Range Status  05/12/2021 42.8 36.0 - 46.0 % Final   Platelets  Date Value Ref Range Status  05/12/2021 274 150 - 400 K/uL Final   Creatinine, Ser  Date Value Ref Range Status  05/12/2021 0.79 0.44 - 1.00 mg/dL Final   AST  Date Value Ref Range Status  07/13/2007 20  Final   ALT  Date Value Ref Range Status   07/13/2007 21  Final   Pelvic ultrasound (01/22/23): Uterus with submucosal fibroid = 18 x 13 mm Endometrial thickened = 13 mm Ovaries within normal limits No adnexal masses or free fluid seen  Surgical pathology (01/22/23): Endometrium, biopsy -endometrioid adenocarcinoma, FIGO grade 1, with foci of squamous differentiation

## 2023-02-12 ENCOUNTER — Encounter: Payer: Self-pay | Admitting: Psychiatry

## 2023-02-12 ENCOUNTER — Other Ambulatory Visit: Payer: Self-pay | Admitting: Gynecologic Oncology

## 2023-02-12 DIAGNOSIS — C541 Malignant neoplasm of endometrium: Secondary | ICD-10-CM

## 2023-02-12 NOTE — Progress Notes (Signed)
Patient here for new patient consultation with Dr. Alvester Morin and for a pre-operative appointment prior to her scheduled surgery on 02/26/2023. She is scheduled for robotic assisted total laparoscopic hysterectomy, bilateral salpingo-oophorectomy, sentinel lymph node biopsy, possible lymph node dissection, possible laparotomy. The surgery was discussed in detail.  See after visit summary for additional details.     Discussed post-op pain management in detail including the aspects of the enhanced recovery pathway.  Advised her that a new prescription would be sent in for tramadol and it is only to be used for after her upcoming surgery.  We discussed the use of tylenol post-op and to monitor for a maximum of 4,000 mg in a 24 hour period.  Also prescribed sennakot to be used after surgery and to hold if having loose stools.  Discussed bowel regimen in detail.     Discussed measures to take at home to prevent DVT including frequent mobility.  Reportable signs and symptoms of DVT discussed. Post-operative instructions discussed and expectations for after surgery. Incisional care discussed as well including reportable signs and symptoms including erythema, drainage, wound separation.     10 minutes spent preparing information and with the patient.  Verbalizing understanding of material discussed. No needs or concerns voiced at the end of the visit.   Advised patient to call for any needs.  Advised that her post-operative medications had been prescribed and could be picked up at any time.    This appointment is included in the global surgical bundle as pre-operative teaching and has no charge.

## 2023-02-12 NOTE — Patient Instructions (Signed)
Preparing for your Surgery  Plan for surgery on February 26, 2023 with Dr. Clide Cliff at [facility]. You will be scheduled for robotic assisted total laparoscopic hysterectomy (removal of the uterus and cervix), bilateral salpingo-oophorectomy (removal of both ovaries and fallopian tubes), sentinel lymph node biopsy, possible lymph node dissection, possible laparotomy (larger incision on your abdomen if needed).   Pre-operative Testing -You will receive a phone call from presurgical testing at [facility] to arrange for a pre-operative appointment and lab work.  -Bring your insurance card, copy of an advanced directive if applicable, medication list  -At that visit, you will be asked to sign a consent for a possible blood transfusion in case a transfusion becomes necessary during surgery.  The need for a blood transfusion is rare but having consent is a necessary part of your care.     -You should not be taking blood thinners or aspirin at least ten days prior to surgery unless instructed by your surgeon.  -Do not take supplements such as fish oil (omega 3), red yeast rice, turmeric before your surgery. STOP TAKING AT LEAST 10 DAYS BEFORE SURGERY. You want to avoid medications with aspirin in them including headache powders such as BC or Goody's), Excedrin migraine.  Day Before Surgery at Home -You will be asked to take in a light diet the day before surgery. You will be advised you can have clear liquids up until 3 hours before your surgery.    Eat a light diet the day before surgery.  Examples including soups, broths, toast, yogurt, mashed potatoes.  AVOID GAS PRODUCING FOODS AND BEVERAGES. Things to avoid include carbonated beverages (fizzy beverages, sodas), raw fruits and raw vegetables (uncooked), or beans.   If your bowels are filled with gas, your surgeon will have difficulty visualizing your pelvic organs which increases your surgical risks.  Your role in  recovery Your role is to become active as soon as directed by your doctor, while still giving yourself time to heal.  Rest when you feel tired. You will be asked to do the following in order to speed your recovery:  - Cough and breathe deeply. This helps to clear and expand your lungs and can prevent pneumonia after surgery.  - STAY ACTIVE WHEN YOU GET HOME. Do mild physical activity. Walking or moving your legs help your circulation and body functions return to normal. Do not try to get up or walk alone the first time after surgery.   -If you develop swelling on one leg or the other, pain in the back of your leg, redness/warmth in one of your legs, please call the office or go to the Emergency Room to have a doppler to rule out a blood clot. For shortness of breath, chest pain-seek care in the Emergency Room as soon as possible. - Actively manage your pain. Managing your pain lets you move in comfort. We will ask you to rate your pain on a scale of zero to 10. It is your responsibility to tell your doctor or nurse where and how much you hurt so your pain can be treated.  Special Considerations -If you are diabetic, you may be placed on insulin after surgery to have closer control over your blood sugars to promote healing and recovery.  This does not mean that you will be discharged on insulin.  If applicable, your oral antidiabetics will be resumed when you are tolerating a solid diet.  -Your final pathology results from surgery should be  available around one week after surgery and the results will be relayed to you when available.  -Dr. Antionette Char is the surgeon that assists your GYN Oncologist with surgery.  If you end up staying the night, the next day after your surgery you will either see Dr. Pricilla Holm, Dr. Alvester Morin, or Dr. Antionette Char.  -FMLA forms can be faxed to [phone] and please allow 5-7 business days for completion.  Pain Management After Surgery -You will be prescribed  your pain medication and bowel regimen medications before surgery so that you can have these available when you are discharged from the hospital. The pain medication is for use ONLY AFTER surgery and a new prescription will not be given.   -Make sure that you have Tylenol and Ibuprofen IF YOU ARE ABLE TO TAKE THESE MEDICATIONS at home to use on a regular basis after surgery for pain control. We recommend alternating the medications every hour to six hours since they work differently and are processed in the body differently for pain relief.  -Review the attached handout on narcotic use and their risks and side effects.   Bowel Regimen -You will be prescribed Sennakot-S to take nightly to prevent constipation especially if you are taking the narcotic pain medication intermittently.  It is important to prevent constipation and drink adequate amounts of liquids. You can stop taking this medication when you are not taking pain medication and you are back on your normal bowel routine.  Risks of Surgery Risks of surgery are low but include bleeding, infection, damage to surrounding structures, re-operation, blood clots, and very rarely death.   Blood Transfusion Information (For the consent to be signed before surgery)  We will be checking your blood type before surgery so in case of emergencies, we will know what type of blood you would need.                                            WHAT IS A BLOOD TRANSFUSION?  A transfusion is the replacement of blood or some of its parts. Blood is made up of multiple cells which provide different functions. Red blood cells carry oxygen and are used for blood loss replacement. White blood cells fight against infection. Platelets control bleeding. Plasma helps clot blood. Other blood products are available for specialized needs, such as hemophilia or other clotting disorders. BEFORE THE TRANSFUSION  Who gives blood for transfusions?  You may be able to  donate blood to be used at a later date on yourself (autologous donation). Relatives can be asked to donate blood. This is generally not any safer than if you have received blood from a stranger. The same precautions are taken to ensure safety when a relative's blood is donated. Healthy volunteers who are fully evaluated to make sure their blood is safe. This is blood bank blood. Transfusion therapy is the safest it has ever been in the practice of medicine. Before blood is taken from a donor, a complete history is taken to make sure that person has no history of diseases nor engages in risky social behavior (examples are intravenous drug use or sexual activity with multiple partners). The donor's travel history is screened to minimize risk of transmitting infections, such as malaria. The donated blood is tested for signs of infectious diseases, such as HIV and hepatitis. The blood is then tested to be sure it  is compatible with you in order to minimize the chance of a transfusion reaction. If you or a relative donates blood, this is often done in anticipation of surgery and is not appropriate for emergency situations. It takes many days to process the donated blood. RISKS AND COMPLICATIONS Although transfusion therapy is very safe and saves many lives, the main dangers of transfusion include:  Getting an infectious disease. Developing a transfusion reaction. This is an allergic reaction to something in the blood you were given. Every precaution is taken to prevent this. The decision to have a blood transfusion has been considered carefully by your caregiver before blood is given. Blood is not given unless the benefits outweigh the risks.  AFTER SURGERY INSTRUCTIONS  Return to work: 4-6 weeks if applicable  Activity: 1. Be up and out of the bed during the day.  Take a nap if needed.  You may walk up steps but be careful and use the hand rail.  Stair climbing will tire you more than you think, you may  need to stop part way and rest.   2. No lifting or straining for 6 weeks over 10 pounds. No pushing, pulling, straining for 6 weeks.  3. No driving for 4-09 days when the following criteria have been met: Do not drive if you are taking narcotic pain medicine and make sure that your reaction time has returned.   4. You can shower as soon as the next day after surgery. Shower daily.  Use your regular soap and water (not directly on the incision) and pat your incision(s) dry afterwards; don't rub.  No tub baths or submerging your body in water until cleared by your surgeon. If you have the soap that was given to you by pre-surgical testing that was used before surgery, you do not need to use it afterwards because this can irritate your incisions.   5. No sexual activity and nothing in the vagina for 12 weeks.  6. You may experience a small amount of clear drainage from your incisions, which is normal.  If the drainage persists, increases, or changes color please call the office.  7. Do not use creams, lotions, or ointments such as neosporin on your incisions after surgery until advised by your surgeon because they can cause removal of the dermabond glue on your incisions.    8. You may experience vaginal spotting after surgery or when the stitches at the top of the vagina begin to dissolve.  The spotting is normal but if you experience heavy bleeding, call our office.  9. Take Tylenol or ibuprofen first for pain if you are able to take these medications and only use narcotic pain medication for severe pain not relieved by the Tylenol or Ibuprofen.  Monitor your Tylenol intake to a max of 4,000 mg in a 24 hour period. You can alternate these medications after surgery.  Diet: 1. Low sodium Heart Healthy Diet is recommended but you are cleared to resume your normal (before surgery) diet after your procedure.  2. It is safe to use a laxative, such as Miralax or Colace, if you have difficulty moving  your bowels before surgery. You have been prescribed Sennakot-S to take at bedtime every evening after surgery to keep bowel movements regular and to prevent constipation.    Wound Care: 1. Keep clean and dry.  Shower daily.  Reasons to call the Doctor: Fever - Oral temperature greater than 100.4 degrees Fahrenheit Foul-smelling vaginal discharge Difficulty urinating Nausea and vomiting  Increased pain at the site of the incision that is unrelieved with pain medicine. Difficulty breathing with or without chest pain New calf pain especially if only on one side Sudden, continuing increased vaginal bleeding with or without clots.   Contacts: For questions or concerns you should contact:  Dr. Clide Cliff at [phone]  Warner Mccreedy, NP at [phone]  After Hours: call [phone] and have the GYN Oncologist paged/contacted (after 5 pm or on the weekends). You will speak with an after hours RN and let he or she know you have had surgery.  Messages sent via mychart are for non-urgent matters and are not responded to after hours so for urgent needs, please call the after hours number.

## 2023-02-13 ENCOUNTER — Other Ambulatory Visit: Payer: Self-pay

## 2023-02-13 ENCOUNTER — Telehealth: Payer: Self-pay

## 2023-02-13 ENCOUNTER — Encounter (HOSPITAL_COMMUNITY)
Admission: RE | Admit: 2023-02-13 | Discharge: 2023-02-13 | Disposition: A | Discharge status: Home / Self Care | Payer: Managed Care, Other (non HMO) | Source: Ambulatory Visit | Attending: Psychiatry | Admitting: Psychiatry

## 2023-02-13 ENCOUNTER — Encounter (HOSPITAL_COMMUNITY): Payer: Self-pay

## 2023-02-13 ENCOUNTER — Other Ambulatory Visit: Payer: Self-pay | Admitting: Gynecologic Oncology

## 2023-02-13 VITALS — BP 127/65 | HR 77 | Temp 97.9°F | Resp 16 | Ht 66.0 in | Wt 262.0 lb

## 2023-02-13 DIAGNOSIS — F419 Anxiety disorder, unspecified: Secondary | ICD-10-CM | POA: Insufficient documentation

## 2023-02-13 DIAGNOSIS — Z01818 Encounter for other preprocedural examination: Secondary | ICD-10-CM | POA: Diagnosis present

## 2023-02-13 DIAGNOSIS — K219 Gastro-esophageal reflux disease without esophagitis: Secondary | ICD-10-CM | POA: Insufficient documentation

## 2023-02-13 DIAGNOSIS — C541 Malignant neoplasm of endometrium: Secondary | ICD-10-CM | POA: Insufficient documentation

## 2023-02-13 DIAGNOSIS — Z8616 Personal history of COVID-19: Secondary | ICD-10-CM | POA: Diagnosis not present

## 2023-02-13 DIAGNOSIS — E876 Hypokalemia: Secondary | ICD-10-CM

## 2023-02-13 DIAGNOSIS — I10 Essential (primary) hypertension: Secondary | ICD-10-CM | POA: Diagnosis not present

## 2023-02-13 HISTORY — DX: Family history of other specified conditions: Z84.89

## 2023-02-13 HISTORY — DX: Peripheral vascular disease, unspecified: I73.9

## 2023-02-13 HISTORY — DX: Malignant (primary) neoplasm, unspecified: C80.1

## 2023-02-13 HISTORY — DX: Unspecified osteoarthritis, unspecified site: M19.90

## 2023-02-13 HISTORY — DX: Gastro-esophageal reflux disease without esophagitis: K21.9

## 2023-02-13 LAB — COMPREHENSIVE METABOLIC PANEL
ALT: 29 U/L (ref 0–44)
AST: 22 U/L (ref 15–41)
Albumin: 4 g/dL (ref 3.5–5.0)
Alkaline Phosphatase: 90 U/L (ref 38–126)
Anion gap: 10 (ref 5–15)
BUN: 13 mg/dL (ref 6–20)
CO2: 26 mmol/L (ref 22–32)
Calcium: 9.3 mg/dL (ref 8.9–10.3)
Chloride: 100 mmol/L (ref 98–111)
Creatinine, Ser: 0.63 mg/dL (ref 0.44–1.00)
GFR, Estimated: >60 mL/min (ref >60)
Glucose, Bld: 120 mg/dL — ABNORMAL HIGH (ref 70–99)
Potassium: 3.2 mmol/L — ABNORMAL LOW (ref 3.5–5.1)
Sodium: 136 mmol/L (ref 135–145)
Total Bilirubin: 1.6 mg/dL — ABNORMAL HIGH (ref 0.0–1.2)
Total Protein: 7.7 g/dL (ref 6.5–8.1)

## 2023-02-13 LAB — CBC
HCT: 46.1 % — ABNORMAL HIGH (ref 36.0–46.0)
Hemoglobin: 14.9 g/dL (ref 12.0–15.0)
MCH: 28.8 pg (ref 26.0–34.0)
MCHC: 32.3 g/dL (ref 30.0–36.0)
MCV: 89.2 fL (ref 80.0–100.0)
Platelets: 310 10*3/uL (ref 150–400)
RBC: 5.17 MIL/uL — ABNORMAL HIGH (ref 3.87–5.11)
RDW: 13.8 % (ref 11.5–15.5)
WBC: 7.5 10*3/uL (ref 4.0–10.5)
nRBC: 0 % (ref 0.0–0.2)

## 2023-02-13 LAB — TYPE AND SCREEN
ABO/RH(D): O POS
Antibody Screen: NEGATIVE

## 2023-02-13 MED ORDER — POTASSIUM CHLORIDE CRYS ER 20 MEQ PO TBCR: 40.0000 meq | EXTENDED_RELEASE_TABLET | Freq: Once | ORAL | 0 refills | Status: DC

## 2023-02-13 NOTE — Telephone Encounter (Signed)
-----   Message from [name] sent at 02/13/2023 11:56 AM EST ----- Please let her know her potassium on her preop labs was slightly low. I will send her in a one time dose of potassium to take now. Please review foods high in potassium and have her increase her intake of these foods. We will recheck her potassium level the morning of surgery. Thanks ----- Message ----- From: Rebecka, [facility] [place] Sent: 02/13/2023   9:14 AM EST To: Jasmine JONETTA Epps, NP

## 2023-02-13 NOTE — Progress Notes (Signed)
Given hypokalemia on preop labs, plan for one time dose of replacement and pt to be advised to increase intake. Will recheck the morning of surgery.

## 2023-02-13 NOTE — Telephone Encounter (Signed)
 Pt is aware of recent results as reported by Vira Grieves NP. She will pick up the Potassium Rx from her pharmacy and increase the potassium rich foods.

## 2023-02-14 ENCOUNTER — Encounter: Payer: Self-pay | Admitting: Obstetrics and Gynecology

## 2023-02-14 NOTE — Progress Notes (Signed)
 Case: [id number] Date/Time: 02/26/23 0945   Procedures:      XI ROBOTIC ASSISTED TOTAL HYSTERECTOMY WITH BILATERAL SALPINGO OOPHORECTOMY     SENTINEL NODE BIOPSY     POSSIBLE LYMPH NODE DISSECTION   Anesthesia type: General   Pre-op diagnosis: endometrial cancer   [facility] RO[facility]   Surgeons: Eldonna Mays, MD       DISCUSSION: [name] is a 58 yo female who presents to PAT prior to surgery above. PMH of HTN, venous insufficiency, GERD, anxiety, s/p ACDF (2015), endometrial cancer.   Seen on 01/25/23 at PCPs office for URI symptoms and diagnosed with COVID. CXR done in office and was negative (scanned in media). Pt reported mild residual cough at PAT visit. Normal vitals. Anticipate she can proceed due to urgency of case and will be >4 weeks after diagnosis by DOS.  VS: BP 127/65   Pulse 77   Temp 36.6 C (Oral)   Resp 16   Ht 5' 6 (1.676 m)   Wt 118.8 kg   SpO2 99%   BMI 42.29 kg/m   PROVIDERS: Tisovec, Charlie ORN, MD   LABS: Labs reviewed: Acceptable for surgery. (all labs ordered are listed, but only abnormal results are displayed)  Labs Reviewed  CBC - Abnormal; Notable for the following components:      Result Value   RBC 5.17 (*)    HCT 46.1 (*)    All other components within normal limits  COMPREHENSIVE METABOLIC PANEL - Abnormal; Notable for the following components:   Potassium 3.2 (*)    Glucose, Bld 120 (*)    Total Bilirubin 1.6 (*)    All other components within normal limits  TYPE AND SCREEN     IMAGES: CXR 01/25/23:  Impression No acute cardiopulmonary disease  EKG 02/13/23 Normal sinus rhythm, rate 71 Cannot rule out Anterior infarct , age undetermined  CV: Stress test 11/03/2015:  Nuclear stress EF: 70%. Blood pressure demonstrated a hypertensive response to exercise. There was no ST segment deviation noted during stress. The study is normal. The left ventricular ejection fraction is hyperdynamic (>65%).   Normal  stress nuclear study with no ischemia or infarction; EF 70 with normal wall motion.   Past Medical History:  Diagnosis Date   Allergic rhinitis    Anxiety    Arthritis    Cancer (HCC)    endometrial cancer   Family history of adverse reaction to anesthesia    sisters slow to wake up   GERD (gastroesophageal reflux disease)    Hypertension    Panic attacks    Peripheral vascular disease (HCC)    Seasonal allergies     Past Surgical History:  Procedure Laterality Date   ANTERIOR FUSION CERVICAL SPINE  2015   BREAST LUMPECTOMY WITH NEEDLE LOCALIZATION Right 07/30/2012   Procedure: RIGHT BREAST WIRE LOCALIZATION LUMPECTOMY ;  Surgeon: Deward GORMAN Curvin DOUGLAS, MD;  Location: [facility];  Service: General;  Laterality: Right;   BREAST SURGERY     CHOLECYSTECTOMY  07/09/2007   lap choli   DIAGNOSTIC LAPAROSCOPY Right 05/25/2008   ectopic preg, partial right salpingectomy   FOOT ARTHRODESIS Right 05/12/2021   Procedure: RIGHT SUBTALAR AND TALONAVICULAR FUSION;  Surgeon: Harden Jerona GAILS, MD;  Location: [facility];  Service: Orthopedics;  Laterality: Right;    MEDICATIONS:  acetaminophen  (TYLENOL ) 500 MG tablet   ALPRAZolam (XANAX) 0.25 MG tablet   amLODipine-olmesartan (AZOR) 5-40 MG tablet   benzonatate (TESSALON) 200  MG capsule   diclofenac Sodium (VOLTAREN) 1 % GEL   hydrochlorothiazide (HYDRODIURIL) 25 MG tablet   ibuprofen (ADVIL,MOTRIN) 200 MG tablet   omeprazole (PRILOSEC) 20 MG capsule   potassium chloride  SA (KLOR-CON  M) 20 MEQ tablet   senna-docusate (SENOKOT-S) 8.6-50 MG tablet   traMADol  (ULTRAM ) 50 MG tablet   No current facility-administered medications for this encounter.   Burnard CHRISTELLA Odis DEVONNA [facility] Short Stay/Anesthesiology [facility] Phone [phone] 02/14/2023 1:44 PM

## 2023-02-18 ENCOUNTER — Inpatient Hospital Stay: Payer: Managed Care, Other (non HMO) | Admitting: Licensed Clinical Social Worker

## 2023-02-18 NOTE — Progress Notes (Signed)
 [facility] Clinical Social Work  Initial Assessment   [name] is a 58 y.o. year old female contacted by phone. Clinical Social Work was referred by new patient protocol for assessment of psychosocial needs.   SDOH (Social Determinants of Health) assessments performed: Yes SDOH Interventions    Flowsheet Row Clinical Support from 02/18/2023 in [facility] - A Dept Of [place] Office Visit from 02/11/2023 in [facility] - A Dept Of [place]. [facility]  SDOH Interventions    Food Insecurity Interventions Intervention Not Indicated Intervention Not Indicated  Housing Interventions Intervention Not Indicated Intervention Not Indicated  Transportation Interventions Intervention Not Indicated Intervention Not Indicated  Utilities Interventions Intervention Not Indicated Intervention Not Indicated       SDOH Screenings   Food Insecurity: No Food Insecurity (02/18/2023)  Housing: Low Risk  (02/18/2023)  Transportation Needs: No Transportation Needs (02/18/2023)  Utilities: Not At Risk (02/18/2023)  Tobacco Use: Low Risk  (02/13/2023)     Distress Screen completed: No     No data to display            Family/Social Information:  Housing Arrangement: patient lives with husband and 44 yo son . 70 yo daughter recently moved out but is home at times Family members/support persons in your life? Family (husband, mom) Transportation concerns: no  Employment: works for [employer].  Income source: Employment Financial concerns: No Type of concern: None Food access concerns: no Religious or spiritual practice: Asberry Bjornstad describes herself as a woman of faith and has been listening to more spiritual music Advanced directives: Not known Services Currently in place:  [employer]  Coping/ Adjustment to diagnosis: Patient understands treatment plan and what happens next? yes, has upcoming surgery. Has had more emotional/tearful moments since  diagnosis. They tend to just last a few minutes each but happen frequently. Pt is mostly worried about her kids and family Concerns about diagnosis and/or treatment:  Family Patient reported stressors: Adjusting to my illness Patient enjoys  coloring.crafts, time with family Current coping skills/ strengths: Ability for insight , Manufacturing systems engineer , Motivation for treatment/growth , Religious Affiliation , and Supportive family/friends     SUMMARY: Current SDOH Barriers:  No major barriers noted today. Pt is more emotional with adjustment to diagnosis  Clinical Social Work Clinical Goal(s):  Patient will work with SW to address concerns related to adjustment  Interventions: Discussed common feeling and emotions when being diagnosed with cancer, and the importance of support during treatment Informed patient of the support team roles and support services at [facility] Provided CSW contact information and encouraged patient to call with any questions or concerns   Follow Up Plan: Patient will contact CSW with any support or resource needs and CSW will follow-up with patient by phone  Patient verbalizes understanding of plan: Yes    Jearldean Gutt E Maleia Weems, LCSW Clinical Social Worker [facility]

## 2023-02-20 ENCOUNTER — Encounter: Payer: Self-pay | Admitting: Gynecologic Oncology

## 2023-02-25 ENCOUNTER — Telehealth: Payer: Self-pay | Admitting: Surgery

## 2023-02-25 NOTE — Telephone Encounter (Signed)
Telephone call to check on pre-operative status.  Patient compliant with pre-operative instructions.  Reinforced nothing to eat after midnight. Clear liquids until 6:45am. Patient to arrive at 7:45am. Verified that post-op medications have been sent to patient's preferred pharmacy.  No questions or concerns voiced.  Instructed to call for any needs.

## 2023-02-26 ENCOUNTER — Encounter (HOSPITAL_COMMUNITY): Payer: Self-pay | Admitting: Psychiatry

## 2023-02-26 ENCOUNTER — Encounter (HOSPITAL_COMMUNITY)
Admission: RE | Disposition: A | Discharge status: Home / Self Care | Payer: Self-pay | Source: Home / Self Care | Attending: Psychiatry

## 2023-02-26 ENCOUNTER — Telehealth: Payer: Self-pay

## 2023-02-26 ENCOUNTER — Ambulatory Visit (HOSPITAL_COMMUNITY): Payer: Managed Care, Other (non HMO) | Admitting: Medical

## 2023-02-26 ENCOUNTER — Ambulatory Visit (HOSPITAL_COMMUNITY)
Admission: RE | Admit: 2023-02-26 | Discharge: 2023-02-26 | Disposition: A | Discharge status: Home / Self Care | Payer: Managed Care, Other (non HMO) | Attending: Psychiatry | Admitting: Psychiatry

## 2023-02-26 ENCOUNTER — Ambulatory Visit (HOSPITAL_BASED_OUTPATIENT_CLINIC_OR_DEPARTMENT_OTHER): Payer: Managed Care, Other (non HMO) | Admitting: Anesthesiology

## 2023-02-26 ENCOUNTER — Other Ambulatory Visit: Payer: Self-pay

## 2023-02-26 DIAGNOSIS — K660 Peritoneal adhesions (postprocedural) (postinfection): Secondary | ICD-10-CM

## 2023-02-26 DIAGNOSIS — K219 Gastro-esophageal reflux disease without esophagitis: Secondary | ICD-10-CM | POA: Diagnosis not present

## 2023-02-26 DIAGNOSIS — I10 Essential (primary) hypertension: Secondary | ICD-10-CM

## 2023-02-26 DIAGNOSIS — E876 Hypokalemia: Secondary | ICD-10-CM

## 2023-02-26 DIAGNOSIS — S3142XA Laceration with foreign body of vagina and vulva, initial encounter: Secondary | ICD-10-CM | POA: Diagnosis not present

## 2023-02-26 DIAGNOSIS — Y838 Other surgical procedures as the cause of abnormal reaction of the patient, or of later complication, without mention of misadventure at the time of the procedure: Secondary | ICD-10-CM | POA: Diagnosis not present

## 2023-02-26 DIAGNOSIS — N8003 Adenomyosis of the uterus: Secondary | ICD-10-CM | POA: Insufficient documentation

## 2023-02-26 DIAGNOSIS — F419 Anxiety disorder, unspecified: Secondary | ICD-10-CM

## 2023-02-26 DIAGNOSIS — Z6841 Body Mass Index (BMI) 40.0 and over, adult: Secondary | ICD-10-CM | POA: Insufficient documentation

## 2023-02-26 DIAGNOSIS — Z01818 Encounter for other preprocedural examination: Secondary | ICD-10-CM

## 2023-02-26 DIAGNOSIS — C541 Malignant neoplasm of endometrium: Secondary | ICD-10-CM | POA: Insufficient documentation

## 2023-02-26 HISTORY — PX: SENTINEL NODE BIOPSY: SHX6608

## 2023-02-26 HISTORY — PX: ROBOTIC ASSISTED TOTAL HYSTERECTOMY WITH BILATERAL SALPINGO OOPHERECTOMY: SHX6086

## 2023-02-26 LAB — BASIC METABOLIC PANEL
Anion gap: 11 (ref 5–15)
BUN: 13 mg/dL (ref 6–20)
CO2: 26 mmol/L (ref 22–32)
Calcium: 9.6 mg/dL (ref 8.9–10.3)
Chloride: 101 mmol/L (ref 98–111)
Creatinine, Ser: 0.71 mg/dL (ref 0.44–1.00)
GFR, Estimated: >60 mL/min (ref >60)
Glucose, Bld: 133 mg/dL — ABNORMAL HIGH (ref 70–99)
Potassium: 3.4 mmol/L — ABNORMAL LOW (ref 3.5–5.1)
Sodium: 138 mmol/L (ref 135–145)

## 2023-02-26 LAB — ABO/RH: ABO/RH(D): O POS

## 2023-02-26 SURGERY — HYSTERECTOMY, TOTAL, ROBOT-ASSISTED, LAPAROSCOPIC, WITH BILATERAL SALPINGO-OOPHORECTOMY
Anesthesia: General

## 2023-02-26 MED ORDER — ALBUMIN HUMAN 5 % IV SOLN: INTRAVENOUS | Status: DC | PRN

## 2023-02-26 MED ORDER — EPHEDRINE 5 MG/ML INJ
INTRAVENOUS | Status: AC
Start: 2023-02-26 — End: ?
  Filled 2023-02-26: qty 5

## 2023-02-26 MED ORDER — ORAL CARE MOUTH RINSE: 15.0000 mL | Freq: Once | OROMUCOSAL | Status: AC

## 2023-02-26 MED ORDER — CEFAZOLIN SODIUM-DEXTROSE 3-4 GM/150ML-% IV SOLN
3.0000 g | INTRAVENOUS | Status: AC
  Administered 2023-02-26: 3 g via INTRAVENOUS
  Filled 2023-02-26: qty 150

## 2023-02-26 MED ORDER — MIDAZOLAM HCL 5 MG/5ML IJ SOLN
INTRAMUSCULAR | Status: DC | PRN
  Administered 2023-02-26: 2 mg via INTRAVENOUS

## 2023-02-26 MED ORDER — LIDOCAINE HCL (CARDIAC) PF 100 MG/5ML IV SOSY
PREFILLED_SYRINGE | INTRAVENOUS | Status: DC | PRN
  Administered 2023-02-26: 100 mg via INTRAVENOUS

## 2023-02-26 MED ORDER — STERILE WATER FOR INJECTION IJ SOLN
INTRAMUSCULAR | Status: DC | PRN
  Administered 2023-02-26: 4 mL via INTRAMUSCULAR

## 2023-02-26 MED ORDER — PHENYLEPHRINE HCL-NACL 20-0.9 MG/250ML-% IV SOLN
INTRAVENOUS | Status: DC | PRN
Start: 2023-02-26 — End: 2023-02-26
  Administered 2023-02-26: 50 ug/min via INTRAVENOUS

## 2023-02-26 MED ORDER — IPRATROPIUM-ALBUTEROL 0.5-2.5 (3) MG/3ML IN SOLN
RESPIRATORY_TRACT | Status: AC
  Administered 2023-02-26: 3 mL via RESPIRATORY_TRACT
  Filled 2023-02-26: qty 3

## 2023-02-26 MED ORDER — PROPOFOL 10 MG/ML IV BOLUS
INTRAVENOUS | Status: AC
  Filled 2023-02-26: qty 20

## 2023-02-26 MED ORDER — FENTANYL CITRATE (PF) 250 MCG/5ML IJ SOLN
INTRAMUSCULAR | Status: AC
  Filled 2023-02-26: qty 5

## 2023-02-26 MED ORDER — GLYCOPYRROLATE 0.2 MG/ML IJ SOLN
INTRAMUSCULAR | Status: DC | PRN
  Administered 2023-02-26: .2 mg via INTRAVENOUS

## 2023-02-26 MED ORDER — ROCURONIUM BROMIDE 10 MG/ML (PF) SYRINGE
PREFILLED_SYRINGE | INTRAVENOUS | Status: AC
  Filled 2023-02-26: qty 40

## 2023-02-26 MED ORDER — OXYCODONE-ACETAMINOPHEN 5-325 MG PO TABS: 1.0000 | ORAL_TABLET | ORAL | 0 refills | Status: DC | PRN

## 2023-02-26 MED ORDER — DEXAMETHASONE SODIUM PHOSPHATE 10 MG/ML IJ SOLN
INTRAMUSCULAR | Status: DC | PRN
Start: 2023-02-26 — End: 2023-02-26
  Administered 2023-02-26: 4 mg via INTRAVENOUS

## 2023-02-26 MED ORDER — STERILE WATER FOR INJECTION IJ SOLN
INTRAMUSCULAR | Status: AC
  Filled 2023-02-26: qty 10

## 2023-02-26 MED ORDER — OXYCODONE HCL 5 MG/5ML PO SOLN: 5.0000 mg | Freq: Once | ORAL | Status: DC

## 2023-02-26 MED ORDER — ALBUMIN HUMAN 5 % IV SOLN
INTRAVENOUS | Status: AC
  Filled 2023-02-26: qty 500

## 2023-02-26 MED ORDER — ONDANSETRON HCL 4 MG/2ML IJ SOLN
INTRAMUSCULAR | Status: DC
Start: 2023-02-26 — End: 2023-02-27
  Filled 2023-02-26: qty 2

## 2023-02-26 MED ORDER — ACETAMINOPHEN 500 MG PO TABS
1000.0000 mg | ORAL_TABLET | ORAL | Status: AC
  Administered 2023-02-26: 1000 mg via ORAL
  Filled 2023-02-26: qty 2

## 2023-02-26 MED ORDER — MEPERIDINE HCL 50 MG/ML IJ SOLN: 6.2500 mg | INTRAMUSCULAR | Status: DC | PRN

## 2023-02-26 MED ORDER — OXYCODONE HCL 5 MG PO TABS
ORAL_TABLET | ORAL | Status: AC
  Administered 2023-02-26: 5 mg via ORAL
  Filled 2023-02-26: qty 1

## 2023-02-26 MED ORDER — CHLORHEXIDINE GLUCONATE 0.12 % MT SOLN
15.0000 mL | Freq: Once | OROMUCOSAL | Status: AC
  Administered 2023-02-26: 15 mL via OROMUCOSAL

## 2023-02-26 MED ORDER — METRONIDAZOLE 500 MG/100ML IV SOLN
500.0000 mg | INTRAVENOUS | Status: AC
Start: 2023-02-26 — End: 2023-02-26
  Administered 2023-02-26: 500 mg via INTRAVENOUS
  Filled 2023-02-26: qty 100

## 2023-02-26 MED ORDER — ROCURONIUM BROMIDE 100 MG/10ML IV SOLN
INTRAVENOUS | Status: DC | PRN
  Administered 2023-02-26: 40 mg via INTRAVENOUS
  Administered 2023-02-26: 60 mg via INTRAVENOUS
  Administered 2023-02-26: 30 mg via INTRAVENOUS

## 2023-02-26 MED ORDER — ONDANSETRON HCL 4 MG/2ML IJ SOLN
4.0000 mg | Freq: Once | INTRAMUSCULAR | Status: AC
  Administered 2023-02-26: 4 mg via INTRAVENOUS

## 2023-02-26 MED ORDER — MIDAZOLAM HCL 2 MG/2ML IJ SOLN: 0.5000 mg | Freq: Once | INTRAMUSCULAR | Status: DC | PRN

## 2023-02-26 MED ORDER — LACTATED RINGERS IV SOLN: INTRAVENOUS | Status: DC

## 2023-02-26 MED ORDER — ONDANSETRON HCL 4 MG/2ML IJ SOLN
INTRAMUSCULAR | Status: DC | PRN
  Administered 2023-02-26: 4 mg via INTRAVENOUS

## 2023-02-26 MED ORDER — HYDROMORPHONE HCL 1 MG/ML IJ SOLN
INTRAMUSCULAR | Status: AC
  Administered 2023-02-26: 0.5 mg via INTRAVENOUS
  Filled 2023-02-26: qty 1

## 2023-02-26 MED ORDER — STERILE WATER FOR INJECTION IJ SOLN
INTRAMUSCULAR | Status: DC | PRN
  Administered 2023-02-26: 2 mL via INTRAMUSCULAR

## 2023-02-26 MED ORDER — FENTANYL CITRATE (PF) 100 MCG/2ML IJ SOLN
INTRAMUSCULAR | Status: DC | PRN
Start: 2023-02-26 — End: 2023-02-26
  Administered 2023-02-26 (×3): 50 ug via INTRAVENOUS
  Administered 2023-02-26: 100 ug via INTRAVENOUS

## 2023-02-26 MED ORDER — DEXAMETHASONE SODIUM PHOSPHATE 4 MG/ML IJ SOLN: 4.0000 mg | INTRAMUSCULAR | Status: DC

## 2023-02-26 MED ORDER — GABAPENTIN 300 MG PO CAPS
300.0000 mg | ORAL_CAPSULE | ORAL | Status: AC
Start: 2023-02-26 — End: 2023-02-26
  Administered 2023-02-26: 300 mg via ORAL
  Filled 2023-02-26: qty 1

## 2023-02-26 MED ORDER — OXYCODONE HCL 5 MG PO TABS
ORAL_TABLET | ORAL | Status: DC
Start: 2023-02-26 — End: 2023-02-27
  Filled 2023-02-26: qty 1

## 2023-02-26 MED ORDER — KETAMINE HCL 50 MG/5ML IJ SOSY
PREFILLED_SYRINGE | INTRAMUSCULAR | Status: DC | PRN
Start: 2023-02-26 — End: 2023-02-26
  Administered 2023-02-26: 30 mg via INTRAVENOUS
  Administered 2023-02-26: 20 mg via INTRAVENOUS

## 2023-02-26 MED ORDER — LACTATED RINGERS IR SOLN
Status: DC | PRN
  Administered 2023-02-26: 1000 mL

## 2023-02-26 MED ORDER — SUGAMMADEX SODIUM 200 MG/2ML IV SOLN
INTRAVENOUS | Status: DC | PRN
  Administered 2023-02-26: 50 mg via INTRAVENOUS
  Administered 2023-02-26: 150 mg via INTRAVENOUS

## 2023-02-26 MED ORDER — BUPIVACAINE HCL 0.25 % IJ SOLN
INTRAMUSCULAR | Status: DC | PRN
  Administered 2023-02-26: 20 mL

## 2023-02-26 MED ORDER — PROPOFOL 10 MG/ML IV BOLUS
INTRAVENOUS | Status: DC | PRN
  Administered 2023-02-26 (×2): 100 mg via INTRAVENOUS

## 2023-02-26 MED ORDER — PHENYLEPHRINE 80 MCG/ML (10ML) SYRINGE FOR IV PUSH (FOR BLOOD PRESSURE SUPPORT)
PREFILLED_SYRINGE | INTRAVENOUS | Status: DC | PRN
Start: 2023-02-26 — End: 2023-02-26
  Administered 2023-02-26 (×6): 160 ug via INTRAVENOUS

## 2023-02-26 MED ORDER — BUPIVACAINE HCL (PF) 0.25 % IJ SOLN
INTRAMUSCULAR | Status: AC
  Filled 2023-02-26: qty 30

## 2023-02-26 MED ORDER — MIDAZOLAM HCL 2 MG/2ML IJ SOLN
INTRAMUSCULAR | Status: AC
  Filled 2023-02-26: qty 2

## 2023-02-26 MED ORDER — IPRATROPIUM-ALBUTEROL 0.5-2.5 (3) MG/3ML IN SOLN
3.0000 mL | Freq: Once | RESPIRATORY_TRACT | Status: AC
Start: 2023-02-26 — End: 2023-02-26

## 2023-02-26 MED ORDER — STERILE WATER FOR IRRIGATION IR SOLN
Status: DC | PRN
  Administered 2023-02-26: 1000 mL

## 2023-02-26 MED ORDER — SCOPOLAMINE 1 MG/3DAYS TD PT72
1.0000 | MEDICATED_PATCH | TRANSDERMAL | Status: DC
  Administered 2023-02-26: 1.5 mg via TRANSDERMAL
  Filled 2023-02-26: qty 1

## 2023-02-26 MED ORDER — EPHEDRINE SULFATE-NACL 50-0.9 MG/10ML-% IV SOSY
PREFILLED_SYRINGE | INTRAVENOUS | Status: DC | PRN
  Administered 2023-02-26: 10 mg via INTRAVENOUS

## 2023-02-26 MED ORDER — HEPARIN SODIUM (PORCINE) 5000 UNIT/ML IJ SOLN
5000.0000 [IU] | INTRAMUSCULAR | Status: AC
  Administered 2023-02-26: 5000 [IU] via SUBCUTANEOUS
  Filled 2023-02-26: qty 1

## 2023-02-26 MED ORDER — OXYCODONE HCL 5 MG PO TABS
5.0000 mg | ORAL_TABLET | Freq: Once | ORAL | Status: AC | PRN
  Administered 2023-02-26: 5 mg via ORAL

## 2023-02-26 MED ORDER — HYDROMORPHONE HCL 1 MG/ML IJ SOLN
0.2500 mg | INTRAMUSCULAR | Status: DC | PRN
  Administered 2023-02-26: 0.5 mg via INTRAVENOUS
  Administered 2023-02-26: 0.25 mg via INTRAVENOUS

## 2023-02-26 MED ORDER — OXYCODONE HCL 5 MG/5ML PO SOLN: 5.0000 mg | Freq: Once | ORAL | Status: AC | PRN

## 2023-02-26 MED ORDER — KETAMINE HCL 50 MG/5ML IJ SOSY
PREFILLED_SYRINGE | INTRAMUSCULAR | Status: AC
  Filled 2023-02-26: qty 5

## 2023-02-26 SURGICAL SUPPLY — 73 items
APPLICATOR SURGIFLO ENDO (HEMOSTASIS) IMPLANT
BAG LAPAROSCOPIC 12 15 PORT 16 (BASKET) IMPLANT
BAG RETRIEVAL 12/15 (BASKET) IMPLANT
BLADE SURG SZ10 CARB STEEL (BLADE) IMPLANT
COVER BACK TABLE 60X90IN (DRAPES) ×1 IMPLANT
COVER TIP SHEARS 8 DVNC (MISCELLANEOUS) ×1 IMPLANT
DERMABOND ADVANCED .7 DNX12 (GAUZE/BANDAGES/DRESSINGS) ×1 IMPLANT
DRAPE ARM DVNC X/XI (DISPOSABLE) ×4 IMPLANT
DRAPE COLUMN DVNC XI (DISPOSABLE) ×1 IMPLANT
DRAPE SHEET LG 3/4 BI-LAMINATE (DRAPES) ×1 IMPLANT
DRAPE SURG IRRIG POUCH 19X23 (DRAPES) ×1 IMPLANT
DRIVER NDL MEGA SUTCUT DVNCXI (INSTRUMENTS) ×1 IMPLANT
DRIVER NDLE MEGA SUTCUT DVNCXI (INSTRUMENTS) ×1 IMPLANT
DRSG OPSITE POSTOP 4X6 (GAUZE/BANDAGES/DRESSINGS) IMPLANT
DRSG OPSITE POSTOP 4X8 (GAUZE/BANDAGES/DRESSINGS) IMPLANT
ELECT PENCIL ROCKER SW 15FT (MISCELLANEOUS) IMPLANT
ELECT REM PT RETURN 15FT ADLT (MISCELLANEOUS) ×1 IMPLANT
FORCEPS BPLR FENES DVNC XI (FORCEP) ×1 IMPLANT
FORCEPS PROGRASP DVNC XI (FORCEP) ×1 IMPLANT
GAUZE 4X4 16PLY ~~LOC~~+RFID DBL (SPONGE) ×1 IMPLANT
GLOVE BIO SURGEON STRL SZ 6 (GLOVE) ×4 IMPLANT
GLOVE BIO SURGEON STRL SZ 6.5 (GLOVE) ×1 IMPLANT
GLOVE BIOGEL PI IND STRL 6.5 (GLOVE) ×2 IMPLANT
GLOVE SURG NEOPR MICRO LF SZ6 (GLOVE) IMPLANT
GOWN STRL REUS W/ TWL LRG LVL3 (GOWN DISPOSABLE) ×4 IMPLANT
GRASPER SUT TROCAR 14GX15 (MISCELLANEOUS) IMPLANT
HOLDER FOLEY CATH W/STRAP (MISCELLANEOUS) IMPLANT
IRRIG SUCT STRYKERFLOW 2 WTIP (MISCELLANEOUS) ×1 IMPLANT
IRRIGATION SUCT STRKRFLW 2 WTP (MISCELLANEOUS) ×1 IMPLANT
KIT PROCEDURE DVNC SI (MISCELLANEOUS) IMPLANT
KIT TURNOVER KIT A (KITS) IMPLANT
LIGASURE IMPACT 36 18CM CVD LR (INSTRUMENTS) IMPLANT
MANIPULATOR ADVINCU DEL 3.0 PL (MISCELLANEOUS) IMPLANT
MANIPULATOR ADVINCU DEL 3.5 PL (MISCELLANEOUS) IMPLANT
MANIPULATOR UTERINE 4.5 ZUMI (MISCELLANEOUS) IMPLANT
NDL HYPO 21X1.5 SAFETY (NEEDLE) ×1 IMPLANT
NDL INSUFFLATION 14GA 120MM (NEEDLE) IMPLANT
NDL SPNL 20GX3.5 QUINCKE YW (NEEDLE) IMPLANT
NEEDLE HYPO 21X1.5 SAFETY (NEEDLE) ×1 IMPLANT
NEEDLE INSUFFLATION 14GA 120MM (NEEDLE) IMPLANT
NEEDLE SPNL 20GX3.5 QUINCKE YW (NEEDLE) ×1 IMPLANT
OBTURATOR OPTICAL STND 8 DVNC (TROCAR) ×1 IMPLANT
OBTURATOR OPTICALSTD 8 DVNC (TROCAR) ×1 IMPLANT
PACK ROBOT GYN CUSTOM WL (TRAY / TRAY PROCEDURE) ×1 IMPLANT
PAD ARMBOARD 7.5X6 YLW CONV (MISCELLANEOUS) ×1 IMPLANT
PAD POSITIONING PINK XL (MISCELLANEOUS) ×1 IMPLANT
PORT ACCESS TROCAR AIRSEAL 12 (TROCAR) IMPLANT
SCISSORS MNPLR CVD DVNC XI (INSTRUMENTS) ×1 IMPLANT
SCRUB CHG 4% DYNA-HEX 4OZ (MISCELLANEOUS) ×2 IMPLANT
SEAL UNIV 5-12 XI (MISCELLANEOUS) ×4 IMPLANT
SET TRI-LUMEN FLTR TB AIRSEAL (TUBING) ×1 IMPLANT
SPIKE FLUID TRANSFER (MISCELLANEOUS) ×1 IMPLANT
SPONGE T-LAP 18X18 ~~LOC~~+RFID (SPONGE) IMPLANT
SURGIFLO W/THROMBIN 8M KIT (HEMOSTASIS) IMPLANT
SUT MNCRL AB 4-0 PS2 18 (SUTURE) IMPLANT
SUT PDS AB 1 TP1 54 (SUTURE) IMPLANT
SUT VIC AB 0 CT1 27XBRD ANTBC (SUTURE) IMPLANT
SUT VIC AB 2-0 CT1 TAPERPNT 27 (SUTURE) IMPLANT
SUT VIC AB 3-0 SH 27X BRD (SUTURE) IMPLANT
SUT VIC AB 4-0 PS2 18 (SUTURE) ×2 IMPLANT
SUT VICRYL 0 27 CT2 27 ABS (SUTURE) ×1 IMPLANT
SUT VICRYL+ 3-0 36IN CT-1 (SUTURE) IMPLANT
SYR 10ML LL (SYRINGE) IMPLANT
SYS BAG RETRIEVAL 10MM (BASKET) IMPLANT
SYS WOUND ALEXIS 18CM MED (MISCELLANEOUS) IMPLANT
SYSTEM BAG RETRIEVAL 10MM (BASKET) IMPLANT
SYSTEM WOUND ALEXIS 18CM MED (MISCELLANEOUS) IMPLANT
TRAP SPECIMEN MUCUS 40CC (MISCELLANEOUS) IMPLANT
TRAY FOLEY MTR SLVR 16FR STAT (SET/KITS/TRAYS/PACK) ×1 IMPLANT
TROCAR PORT AIRSEAL 5X120 (TROCAR) IMPLANT
UNDERPAD 30X36 HEAVY ABSORB (UNDERPADS AND DIAPERS) ×2 IMPLANT
WATER STERILE IRR 1000ML POUR (IV SOLUTION) ×1 IMPLANT
YANKAUER SUCT BULB TIP 10FT TU (MISCELLANEOUS) IMPLANT

## 2023-02-26 NOTE — Telephone Encounter (Signed)
 FMLA forms received from Pt's employer, [employer]. Requested information filled out and emailed back to patient.

## 2023-02-26 NOTE — Anesthesia Preprocedure Evaluation (Addendum)
 Anesthesia Evaluation  Patient identified by MRN, date of birth, ID band Patient awake    Reviewed: Allergy & Precautions, NPO status , Patient's Chart, lab work & pertinent test results  History of Anesthesia Complications Negative for: history of anesthetic complications  Airway Mallampati: II  TM Distance: >3 FB Neck ROM: Full    Dental  (+) Dental Advisory Given, Teeth Intact   Pulmonary neg pulmonary ROS   breath sounds clear to auscultation       Cardiovascular hypertension, Pt. on medications (-) angina + Peripheral Vascular Disease   Rhythm:Regular Rate:Normal  '17 Stress:   Nuclear stress EF: 70%.  Blood pressure demonstrated a hypertensive response to exercise.  There was no ST segment deviation noted during stress.  The study is normal.  The left ventricular ejection fraction is hyperdynamic (>65%).    Neuro/Psych   Anxiety     negative neurological ROS     GI/Hepatic Neg liver ROS,GERD  Medicated and Controlled,,  Endo/Other    Class 4 obesityBMI 42.3  Renal/GU negative Renal ROS     Musculoskeletal   Abdominal   Peds  Hematology negative hematology ROS (+)   Anesthesia Other Findings   Reproductive/Obstetrics                             Anesthesia Physical Anesthesia Plan  ASA: 3  Anesthesia Plan: General   Post-op Pain Management: Tylenol PO (pre-op)*   Induction: Intravenous  PONV Risk Score and Plan: 3 and Ondansetron, Dexamethasone and Treatment may vary due to age or medical condition  Airway Management Planned: Oral ETT  Additional Equipment: None  Intra-op Plan:   Post-operative Plan: Extubation in OR  Informed Consent: I have reviewed the patients History and Physical, chart, labs and discussed the procedure including the risks, benefits and alternatives for the proposed anesthesia with the patient or authorized representative who has indicated  his/her understanding and acceptance.     Dental advisory given  Plan Discussed with: CRNA and Surgeon  Anesthesia Plan Comments:         Anesthesia Quick Evaluation

## 2023-02-26 NOTE — OR Nursing (Signed)
 Verified with pharmacy, 3 gm Ancef ok for and approved for surgery for patient even with 118.8kg weight.

## 2023-02-26 NOTE — Interval H&P Note (Signed)
 History and Physical Interval Note:  02/26/2023 9:50 AM  [name]  has presented today for surgery, with the diagnosis of endometrial cancer.  The various methods of treatment have been discussed with the patient and family. After consideration of risks, benefits and other options for treatment, the patient has consented to  Procedure(s): XI ROBOTIC ASSISTED TOTAL HYSTERECTOMY WITH BILATERAL SALPINGO OOPHORECTOMY (N/A) SENTINEL NODE BIOPSY (N/A) POSSIBLE LYMPH NODE DISSECTION (N/A) as a surgical intervention.  The patient's history has been reviewed, patient examined, no change in status, stable for surgery.  I have reviewed the patient's chart and labs.  Questions were answered to the patient's satisfaction.     Eimy Plaza

## 2023-02-26 NOTE — Op Note (Signed)
 GYNECOLOGIC ONCOLOGY OPERATIVE NOTE  Date of Service: 02/26/2023  Preoperative Diagnosis: FIGO grade 1 endometrioid endometrial cancer, obesity (BMI 42)  Postoperative Diagnosis: Same  Procedures: Robotic-assisted total laparoscopic hysterectomy, bilateral salpingo-oophorectomy, bilateral sentinel lymph node evaluation and biopsy, vaginal laceration repair (Modifer 22: extreme morbid obesity, BMI 42, with significant retroperitoneal and intraperitoneal adiposity requiring additional OR personnel for positioning and retraction. Obesity made retroperitoneal visualization limited, increasing the complexity of the case and necessitating additional instrumentation for retraction and to create safe exposure. Obesity related complexity increased the duration of the procedure by 30 minutes.)   Surgeon: Clide Cliff, MD  Assistants: Antionette Char, MD and (an MD assistant was necessary for tissue manipulation, management of robotic instrumentation, retraction and positioning due to the complexity of the case and hospital policies)  Anesthesia: General  Estimated Blood Loss: 50 ml    Fluids: 1200 ml, crystalloid  Urine Output: 100 ml, clear yellow  Findings: On speculum exam narrow introitus and narrow apex of vagina making visualization of cervix challenging. On laparoscopy, normal upper abdominal survey with normal liver surface and diaphragm. Normal appearing small and large bowel. Few filmy adhesions of the omentum to the anterior abdominal wall at the level of the umbilicus. Normal uterus, tubes, and ovaries. Surgically absent right fallopian tube. Few scattered 2mm nodules on sigmoid peritoneum, possible inflammatory nodules. Filmy adhesions of the sigmoid and rectum to the posterior cul-de-sac and left IP ligament. Filmy adhesions of the cecum to the right pelvic sidewall.. Sentinel mapping on right to the obturator space; sentinel mapping on left to the external iliac vein. Laceration of  the left vaginal sidewall near the vaginal apex, made hemostatic with a figure of eight stitch.    Specimens:  ID Type Source Tests Collected by Time Destination  1 : Right Obturator Sentinel Lymph Node Tissue PATH Sentinel Lymph Node SURGICAL PATHOLOGY Clide Cliff, MD 02/26/2023 1150   2 : Left External Iliac Vein Sentinel Lymph Node Tissue PATH Sentinel Lymph Node SURGICAL PATHOLOGY Clide Cliff, MD 02/26/2023 1206   3 : Uterus, Cervix, Bilateral Tubes and Ovaries Tissue PATH Gyn tumor resection SURGICAL PATHOLOGY Clide Cliff, MD 02/26/2023 1232   4 : sigmoid peritoneum biopsy Tissue PATH Other SURGICAL PATHOLOGY Clide Cliff, MD 02/26/2023 1248     Complications:  None  Indications for Procedure: [name] is a 58 y.o. woman with FIGO grade 1 endometrioid endometrial cancer.  Prior to the procedure, all risks, benefits, and alternatives were discussed and informed surgical consent was signed.  Procedure: Patient was taken to the operating room where general anesthesia was achieved.  She was positioned in dorsal lithotomy and prepped and draped.  A foley catheter was inserted into the bladder. 1 ml of dilute Indo-Cyanine dye was was injected at 1cm and 1mm deep at 3 and 9 o'clock in the cervical stroma.  The cervix was dilated and an Advincula uterine manipulator with a colpotomy ring was inserted into the uterus.  A 12 mm incision was made in the left upper quadrant near Palmer's point.  The abdomen was entered with a 5 mm OptiView trocar under direct visualization.  The abdomen was insufflated, the patient placed in steep Trendelenburg, and additional trocars were placed as follows: an 8mm trocar superior to the umbilicus, two 8 mm robotic trocars in the right abdomen, and one 8 mm robotic trocar in the left abdomen.  The left upper quadrant trocar was removed and replaced with a 12 mm airseal trocar.  All trocars were placed under  direct visualization. Filmy adhesions of the  omentum to the anterior abdominal wall at the level of the umbilicus were lysed with scissors. The bowels were moved into the upper abdomen.  The DaVinci robotic surgical system was brought to the patient's bedside and docked.  Adhesions of the cecum to the right pelvic sidewall lysed sharply with scissors, and the cecum was mobilized off of the right pelvic sidewall and IP ligament. The right round ligament was transected and the retroperitoneum entered.The right ureter was identified. The paravesical and pararectal spaces were opened, and the node was found to be located in the obturator space. A sentinel lymph node dissection was performed taking care to avoid injury to the ureter, superior vesicle artery or obturator nerve. A similar procedure was performed on left with the sentinel lymph node found on the left external iliac vein. The sentinel lymph nodes mentioned above were identified and removed through the assistant trocar.  Adhesions of the sigmoid colon to the left pelvic sidewall were lysed with scissors. The left ureter was again identified, and the left infundibulopelvic ligament was isolated, cauterized, and transected. Adhesions of the sigmoid to the posterior cul-de-sac and posterior cervix were lysed with scissors. The posterior peritoneum was opened to the colpotomy ring. The anterior peritoneum was opened and the bladder flap was created. The left uterine artery was skeletonized, cauterized, and transected at the level of the colpotomy ring. Additional cautery was used in a C-shaped fashion to allow the remainder of the broad, cardinal, and uterosacral ligaments with the uterine vessels to be transected and fall away from the colpotomy ring.  A similar procedure was performed on the contralateral side. A colpotomy was made circumferentially following the contours of the colpotomy ring.  The uterine specimen was removed through the vagina.    The vaginal cuff was closed with a running stitch  of 0 Vicryl suture.  The pelvis was irrigated and all operative sites were found to be hemostatic.  All instruments were removed and the robot was taken from the patient's bedside. The fascia at the 12 mm incision was closed with 0 Vicryl using a PMI device. The abdomen was desufflated and all ports were removed. The skin at all incisions was closed with 4-0 Vicryl to reapproximate the subcutaneous tissue and 4-0 monocryl in a subcuticular fashion followed by surgical glue.  The vagina was then inspected and noted to have some bleeding. A speculum was placed and a superficial vaginal wall laceration was noted of the left proximal vaginal wall. A figure of eight stitch with 3-0 vicryl was placed with hemostasis achieved. An additional figure of eight stitch was placed at the posterior fourchette and no additional bleeding was noted from the vagina.   Patient tolerated the procedure well. Sponge, lap, and instrument counts were correct.  Patient received 3 gm of Ancef and 500mg  of metronidazole prior to skin incision for routine perioperative antibiotic prophylaxis.  She was extubated and taken to the PACU in stable condition.  Clide Cliff, MD Gynecologic Oncology

## 2023-02-26 NOTE — Anesthesia Procedure Notes (Signed)
 Procedure Name: Intubation Date/Time: 02/26/2023 10:35 AM  Performed by: Sandie Ano, CRNAPre-anesthesia Checklist: Patient identified, Emergency Drugs available, Suction available and Patient being monitored Patient Re-evaluated:Patient Re-evaluated prior to induction Oxygen Delivery Method: Circle System Utilized Preoxygenation: Pre-oxygenation with 100% oxygen Induction Type: IV induction Ventilation: Mask ventilation without difficulty Laryngoscope Size: Mac and 3 Grade View: Grade III Tube type: Oral Tube size: 7.5 mm Number of attempts: 1 Airway Equipment and Method: Stylet and Oral airway Placement Confirmation: ETT inserted through vocal cords under direct vision, positive ETCO2 and breath sounds checked- equal and bilateral Secured at: 23 cm Tube secured with: Tape Dental Injury: Teeth and Oropharynx as per pre-operative assessment

## 2023-02-26 NOTE — Transfer of Care (Signed)
 Immediate Anesthesia Transfer of Care Note  Patient: [name]  Procedure(s) Performed: XI ROBOTIC ASSISTED TOTAL HYSTERECTOMY WITH BILATERAL SALPINGO OOPHORECTOMY SENTINEL NODE BIOPSY  Patient Location: PACU  Anesthesia Type:General  Level of Consciousness: awake, alert , and oriented  Airway & Oxygen Therapy: Patient Spontanous Breathing and Patient connected to nasal cannula oxygen  Post-op Assessment: Report given to RN and Post -op Vital signs reviewed and stable  Post vital signs: Reviewed and stable  Last Vitals:  Vitals Value Taken Time  BP 112/62 02/26/23 1342  Temp 36.4 C 02/26/23 1342  Pulse 73 02/26/23 1343  Resp 17 02/26/23 1343  SpO2 88 % 02/26/23 1343  Vitals shown include unfiled device data.  Last Pain:  Vitals:   02/26/23 0815  TempSrc:   PainSc: 0-No pain      Patients Stated Pain Goal: 5 (02/26/23 0815)  Complications: No notable events documented.

## 2023-02-26 NOTE — Discharge Instructions (Addendum)
 AFTER SURGERY INSTRUCTIONS   Return to work: 4-6 weeks if applicable   Activity: 1. Be up and out of the bed during the day.  Take a nap if needed.  You may walk up steps but be careful and use the hand rail.  Stair climbing will tire you more than you think, you may need to stop part way and rest.    2. No lifting or straining for 6 weeks over 10 pounds. No pushing, pulling, straining for 6 weeks.   3. No driving for 2-95 days when the following criteria have been met: Do not drive if you are taking narcotic pain medicine and make sure that your reaction time has returned.    4. You can shower as soon as the next day after surgery. Shower daily.  Use your regular soap and water (not directly on the incision) and pat your incision(s) dry afterwards; don't rub.  No tub baths or submerging your body in water until cleared by your surgeon. If you have the soap that was given to you by pre-surgical testing that was used before surgery, you do not need to use it afterwards because this can irritate your incisions.    5. No sexual activity and nothing in the vagina for 12 weeks.   6. You may experience a small amount of clear drainage from your incisions, which is normal.  If the drainage persists, increases, or changes color please call the office.   7. Do not use creams, lotions, or ointments such as neosporin on your incisions after surgery until advised by your surgeon because they can cause removal of the dermabond glue on your incisions.     8. You may experience vaginal spotting after surgery or when the stitches at the top of the vagina begin to dissolve.  The spotting is normal but if you experience heavy bleeding, call our office.   9. Take Tylenol or ibuprofen first for pain if you are able to take these medications and only use narcotic pain medication for severe pain not relieved by the Tylenol or Ibuprofen.  Monitor your Tylenol intake to a max of 4,000 mg in a 24 hour period. You can  alternate these medications after surgery.   Diet: 1. Low sodium Heart Healthy Diet is recommended but you are cleared to resume your normal (before surgery) diet after your procedure.   2. It is safe to use a laxative, such as Miralax or Colace, if you have difficulty moving your bowels before surgery. You have been prescribed Sennakot-S to take at bedtime every evening after surgery to keep bowel movements regular and to prevent constipation.     Wound Care: 1. Keep clean and dry.  Shower daily.   Reasons to call the Doctor: Fever - Oral temperature greater than 100.4 degrees Fahrenheit Foul-smelling vaginal discharge Difficulty urinating Nausea and vomiting Increased pain at the site of the incision that is unrelieved with pain medicine. Difficulty breathing with or without chest pain New calf pain especially if only on one side Sudden, continuing increased vaginal bleeding with or without clots.   Contacts: For questions or concerns you should contact:   Dr. Clide Cliff at [phone]   Warner Mccreedy, NP at [phone]   After Hours: call [phone] and have the GYN Oncologist paged/contacted (after 5 pm or on the weekends). You will speak with an after hours RN and let he or she know you have had surgery.   Messages sent via mychart are for non-urgent  matters and are not responded to after hours so for urgent needs, please call the after hours number.

## 2023-02-26 NOTE — Anesthesia Postprocedure Evaluation (Signed)
 Anesthesia Post Note  Patient: [name]  Procedure(s) Performed: XI ROBOTIC ASSISTED TOTAL HYSTERECTOMY WITH BILATERAL SALPINGO OOPHORECTOMY SENTINEL NODE BIOPSY     Patient location during evaluation: PACU Anesthesia Type: General Level of consciousness: sedated, patient cooperative and oriented Pain management: pain level controlled Vital Signs Assessment: post-procedure vital signs reviewed and stable Respiratory status: spontaneous breathing, nonlabored ventilation and respiratory function stable Cardiovascular status: blood pressure returned to baseline and stable Postop Assessment: no apparent nausea or vomiting Anesthetic complications: no   No notable events documented.  Last Vitals:  Vitals:   02/26/23 1500 02/26/23 1511  BP: 109/61   Pulse: 66 88  Resp: 12 16  Temp:    SpO2: 95% 95%    Last Pain:  Vitals:   02/26/23 1430  TempSrc:   PainSc: 10-Worst pain ever                 Gracynn Rajewski,E. Yasuo Phimmasone

## 2023-02-27 ENCOUNTER — Encounter (HOSPITAL_COMMUNITY): Payer: Self-pay | Admitting: Psychiatry

## 2023-02-27 ENCOUNTER — Telehealth: Payer: Self-pay | Admitting: *Deleted

## 2023-02-27 NOTE — Telephone Encounter (Signed)
 Spoke with [name] this morning. She states she hasn't had any solid food yet but drinking ok and she is experiencing some burning on urination. Advised pt this could be from the catheter and she also has a vaginal laceration that was repaired. Pt states she was aware of the "stiches down there". Pt encouraged to use a peri bottle with urination and advised the vaginal stitches will dissolve and heal within a few weeks. Pt also states she is not passing gas and hasn't had a BM yet. Pt encouraged ambulation, hot mint herbal tea and over the counter mylicon chews (gas-x) to help her pass gas. She is taking senokot as prescribed and encouraged her to drink plenty of water. Advised pt if she hasn't had a BM by Thursday, to increase the Senokot to twice daily and take 1 capful of miralax twice daily. Stop with loose stools and adjust accordingly. Pt verbalized understanding.    She denies fever or chills. Incisions are dry and intact. She rates her pain 10 /10 when getting out of bed and 5/10 otherwise. Her pain is controlled with tramadol. States the incision on the left upper side of abdomen hurts the most. Advised pt to follow all activity restrictions.   Instructed to call office with any fever, chills, purulent drainage, uncontrolled pain or any other questions or concerns. Patient verbalizes understanding.   Pt aware of post op appointments as well as the office number [phone] and after hours number [phone] to call if she has any questions or concerns

## 2023-02-28 NOTE — Telephone Encounter (Signed)
 Spoke with [name] who states the burning on urination is resolving. Pt states she is finally passing gas, but hasn't had a bowel movement yet. She is taking her senokot at night and continues with miralax.  Pt has only taken the tramadol twice, once when she came home from the hospital on Tuesday evening and took one in the morning, yesterday. Pt rates her pain when she has to move and get up 9/10 pain in her incisions only, when she is walking it subsides to a 4/10, and when she is still it's 2/10. Pt is mainly taking tylenol only.  Advised patient it's ok to take the tramadol if she needs it. Pt is concerned about getting constipated. Pt advised that she may only need the tramadol for the first few days and then she can switch to just tylenol. Pt verbalized understanding and thanked the office for calling.

## 2023-03-01 ENCOUNTER — Other Ambulatory Visit: Payer: Self-pay | Admitting: Gynecologic Oncology

## 2023-03-01 DIAGNOSIS — C541 Malignant neoplasm of endometrium: Secondary | ICD-10-CM

## 2023-03-01 MED ORDER — TRAMADOL HCL 50 MG PO TABS: 50.0000 mg | ORAL_TABLET | Freq: Four times a day (QID) | ORAL | 0 refills | Status: DC | PRN

## 2023-03-01 NOTE — Telephone Encounter (Signed)
 Spoke with [name] who states her upper left abdominal incision is the most uncomfortable and she still hasn't had a bowel movement yet. Pt reports passing lot's of gas, denies gas pain, denies lower abdominal pain. Pt also denies nausea and no vomiting.  Pt also states she is not eating solid food and doesn't have an appetite. Pt continues with recommended bowel regime and is tolerating liquids well and is keeping herself well hydrated.   Advised patient that Warner Mccreedy, NP will send in a refill of tramadol and to try alternating ice and heating pad to area of upper left abdomin closet to incision. Pt also advised to try binding the area with yoga pants or to help support and keep extra weight off the area of soreness. Pt verbalized understanding and thanked the office.

## 2023-03-01 NOTE — Progress Notes (Signed)
 See RN note.

## 2023-03-04 ENCOUNTER — Inpatient Hospital Stay: Payer: Managed Care, Other (non HMO) | Admitting: Licensed Clinical Social Worker

## 2023-03-04 ENCOUNTER — Encounter: Payer: Self-pay | Admitting: Psychiatry

## 2023-03-04 LAB — SURGICAL PATHOLOGY

## 2023-03-04 NOTE — Progress Notes (Signed)
 [facility] CSW Progress Note  Clinical Child psychotherapist contacted patient by phone to follow-up on mental and emotional coping post-surgery. Pt reports she feels she is coping okay. The most difficult thing has been pain which is more than she expected and is keeping her from doing her typical tasks. She is having a hard time feeling like she is not doing what she normally does for her family.  CSW normalized difficulty dealing with pain and reiterated short-term nature of being unable to do the tasks.  Briefly discussed pain gate theory and ways to help close pain gate to help deal with it mentally.   CSW reviewed availability and encouraged pt to call if she finds she is having trouble coping.    Dell Briner E Beulah Capobianco, LCSW Clinical Social Worker [facility]

## 2023-03-04 NOTE — Telephone Encounter (Signed)
 Called patient to check on her. She states she has been taking Tramadol but that she is still having pain with movement. Advised patient to continue pain regimen and can add ibuprofen in addition if she is able to tolerate that. Patient states she has been able to have a BM as of this morning and is taking senokot and miralax. No other concerns at this time. Advised patient to call our office if pain does not continue to improve or if she has any other questions or concerns.

## 2023-03-07 ENCOUNTER — Other Ambulatory Visit: Payer: Self-pay | Admitting: Gynecologic Oncology

## 2023-03-07 ENCOUNTER — Telehealth: Payer: Self-pay | Admitting: *Deleted

## 2023-03-07 DIAGNOSIS — C541 Malignant neoplasm of endometrium: Secondary | ICD-10-CM

## 2023-03-07 MED ORDER — TRAMADOL HCL 50 MG PO TABS
50.0000 mg | ORAL_TABLET | Freq: Two times a day (BID) | ORAL | 0 refills | Status: DC | PRN
Start: 2023-03-07 — End: 2023-07-15

## 2023-03-07 NOTE — Progress Notes (Signed)
 See RN note.

## 2023-03-07 NOTE — Telephone Encounter (Signed)
 Spoke with Ms. Wechter and relayed message from Warner Mccreedy, NP that pt needs to take something for her cough and tessalon pearls are safe or any other over the counter cough medicine as well. Senokot-S refill will be sent in as well as 5 more tramadol Rx. Continue to take tylenol during the day and only tramadol for pain greater than 7 if needed. Continue using heat, ice, splint abdominal incision when  coughing. Monitor vaginal bleeding, stitches could be strained with coughing. Reminded patient to call if vaginal bleeding becomes heavier. Pt verbalized understanding and thanked the office for calling.

## 2023-03-07 NOTE — Telephone Encounter (Signed)
 Spoke with [name] who called the office stating she hasn't been sleeping well the past three nights because of a cough that is making her left upper abd. Incision hurt and rates the pain 10/10 when she coughs only.  Pt is using heating pad, taking tramadol at night only. Pt is asking if it's ok to take a Rx. Of Tessalon capsules from her PCP?  Pt denies fever, chills and states her cough is non-productive. Pt advised it's ok to take the Tessalon capsules.   Pt also reports having bright red vaginal bleeding with no clots one time yesterday. Pt is not wearing a pad, the bleeding was light and only happened once. Pt denies any activity that precipitated the bleeding and is following all post op activity restrictions. Pt states she is having regular bowel movements and continues to take senokot daily. Pt denies all urinary symptoms but states she is having vaginal burning that is not constant and noticeable when after using the bathroom or for sitting for longer periods of time. Pt advised to use a ice pack and continue using peri bottle with urination. Advised patient her message would be relayed to provider and the office would call back with any new recommendations.

## 2023-03-11 ENCOUNTER — Inpatient Hospital Stay: Payer: Managed Care, Other (non HMO) | Attending: Psychiatry | Admitting: Psychiatry

## 2023-03-11 ENCOUNTER — Encounter: Payer: Self-pay | Admitting: Psychiatry

## 2023-03-11 VITALS — BP 115/61 | HR 75 | Temp 98.7°F | Resp 20 | Wt 262.2 lb

## 2023-03-11 DIAGNOSIS — C541 Malignant neoplasm of endometrium: Secondary | ICD-10-CM | POA: Diagnosis present

## 2023-03-11 DIAGNOSIS — Z9079 Acquired absence of other genital organ(s): Secondary | ICD-10-CM | POA: Diagnosis not present

## 2023-03-11 DIAGNOSIS — Z90722 Acquired absence of ovaries, bilateral: Secondary | ICD-10-CM | POA: Diagnosis not present

## 2023-03-11 DIAGNOSIS — Z7189 Other specified counseling: Secondary | ICD-10-CM

## 2023-03-11 DIAGNOSIS — Z90710 Acquired absence of both cervix and uterus: Secondary | ICD-10-CM | POA: Diagnosis not present

## 2023-03-11 NOTE — Patient Instructions (Signed)
 It was a pleasure to see you in clinic today. - no lifting >10lbs until 6 weeks after surgery. Nothing in the vagina until 10wks after surgery. - Return visit planned for 6 months  Thank you very much for allowing me to provide care for you today.  I appreciate your confidence in choosing our Gynecologic Oncology team at [facility].  If you have any questions about your visit today please call our office or send [place] a MyChart message and we will get back to you as soon as possible.

## 2023-03-11 NOTE — Progress Notes (Signed)
 Gynecologic Oncology Return Clinic Visit  Date of Service: 03/11/2023 Referring Provider: Jule Economy, MD   Assessment & Plan: [name] is a 58 y.o. 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  Postop: - Pt recovering well from surgery and healing appropriately postoperatively - Intraoperative findings and pathology results reviewed. - Ongoing postoperative expectations and precautions reviewed. Continue with no lifting >10lbs through 6 weeks postoperatively. Nothing in the vagina until 8 weeks postop. - Okay to return to work at 6 weeks.  Endometrial cancer: - Pathology results reviewed in detail - No high-intermediate risk factors. - Recommend observation. - Signs/symptoms of recurrence reviewed. - Surveillance reviewed. Follow-up q6 months x2 years then yearly.   RTC 6 mo.  Clide Cliff, MD Gynecologic Oncology   Medical Decision Making I personally spent  TOTAL 20 minutes face-to-face and non-face-to-face in the care of this patient, which includes all pre, intra, and post visit time on the date of service. The discussion of treatment of endometrial cancer is beyond the scope of routine postoperative care.   ----------------------- Reason for Visit: Postop/Treatment counseling  Treatment History: Oncology History  Endometrial cancer (HCC)  01/22/2023 Initial Biopsy   Endometrium, biopsy -endometrioid adenocarcinoma, FIGO grade 1, with foci of squamous differentiation    01/22/2023 Initial Diagnosis   Endometrial cancer (HCC)   02/26/2023 Surgery   Robotic-assisted total laparoscopic hysterectomy, bilateral salpingo-oophorectomy, bilateral sentinel lymph node evaluation and biopsy, vaginal laceration repair    02/26/2023 Pathology Results   A. SENTINEL LYMPH NODE, RIGHT, OBTURATOR:      One lymph node, negative for metastatic carcinoma  (0/1).  B. SENTINEL LYMPH NODE, LEFT, EXTERNAL, ILIAC, VEIN:      One lymph node, negative for metastatic carcinoma (0/1).  C. UTERUS, CERVIX, BILATERAL TUBES AND OVARIES, TOTAL ABDOMINAL HYSTRECTOMY AND BILATERAL SALPIINGO-OOPHONECTOMY:      Endometrioid adenocarcinoma, FIGO grade 2.      Carcinoma invades 20% of myometrium (3 mm / 15 mm).      Lymphovascular invasion is not identified.      Background inactive endometrium.      Myometrium with adenomyosis.      Bilateral ovaries without diagnostic alteration.      Bilateral fimbriated fallopian tubes without diagnostic alteration.      See oncology table.  D. SIGMOID PERITONEAL, BIOPSY:      Benign fibrous nodule with giant cell reactions, calcification and necrotic squamous debris.      Negative for malignancy.  ONCOLOGY TABLE:  UTERUS, CARCINOMA OR CARCINOSARCOMA: Resection  Procedure: Total hysterectomy and bilateral salpingo-oophorectomy Histologic Type: Endometrioid adenocarcinoma Histologic Grade: FIGO grade 2 Myometrial Invasion:      Depth of Myometrial Invasion (mm): 3 mm      Myometrial Thickness (mm): 15 mm      Percentage of Myometrial Invasion: 20% Uterine Serosa Involvement: Not identified Cervical stromal Involvement: Not identified Extent of involvement of other tissue/organs: Not identified Peritoneal/Ascitic Fluid: Not done Lymphovascular Invasion: Not identified Regional Lymph Nodes:      Pelvic Lymph Nodes Examined:          [2] Sentinel          [0] Non-sentinel          [2] Total      Pelvic Lymph Nodes with Metastasis: 0          Macrometastasis: (>2.0 mm): 0  Micrometastasis: (>0.2 mm and < 2.0 mm): 0          Isolated Tumor Cells (<0.2 mm): 0          Laterality of Lymph Node with Tumor: NA          Extracapsular Extension: NA      Para-aortic Lymph Nodes Examined:          [0] Sentinel          0[]  Non-sentinel          [0] Total      Para-aortic Lymph Nodes with Metastasis: NA           Macrometastasis: (>2.0 mm): NA          Micrometastasis:  (>0.2 mm and < 2.0 mm): NA          Isolated Tumor Cells (<0.2 mm): NA          Laterality of Lymph Node with Tumor: NA          Extracapsular Extension: NA Distant Metastasis:      Distant Site(s) Involved: Not applicable Pathologic Stage Classification (pTNM, AJCC 8th Edition): pT1a, pN0 Ancillary Studies: MMR / MSI testing will be ordered Representative Tumor Block: C5 Comment(s): See comment   COMMENT:  C. Immunohistochemical stain for p53 and ER were performed. ER shows  weakly positivity. P53 is wild type pattern of staining.   IHC EXPRESSION RESULTS  TEST           RESULT  MLH1:          Preserved nuclear expression  MSH2:          Preserved nuclear expression  MSH6:          Preserved nuclear expression  PMS2:          Preserved nuclear expression    02/26/2023 Cancer Staging   Staging form: Corpus Uteri - Carcinoma and Carcinosarcoma, AJCC 8th Edition and FIGO 2023 - Clinical stage from 02/26/2023: FIGO Stage IA2, calculated as Stage IA (cT1a, cN0, cM0, POLE: Not Assessed, MMRd-, p53-) - Signed by Clide Cliff, MD on 03/11/2023 Histopathologic type: Endometrioid adenocarcinoma, NOS Stage prefix: Initial diagnosis Histologic grade (G): G2 Histologic grading system: 3 grade system     Interval History: Pt reports that she is recovering well from surgery. She is using tylenol and tramadol for pain. She is eating and drinking well. She is voiding without issue and having regular bowel movements. No current bleeding.    Past Medical/Surgical History: Past Medical History:  Diagnosis Date   Allergic rhinitis    Anxiety    Arthritis    Cancer (HCC)    endometrial cancer   Family history of adverse reaction to anesthesia    sisters slow to wake up   GERD (gastroesophageal reflux disease)    Hypertension    Panic attacks    Peripheral vascular disease (HCC)    Seasonal allergies     Past Surgical  History:  Procedure Laterality Date   ANTERIOR FUSION CERVICAL SPINE  2015   BREAST LUMPECTOMY WITH NEEDLE LOCALIZATION Right 07/30/2012   Procedure: RIGHT BREAST WIRE LOCALIZATION LUMPECTOMY ;  Surgeon: Robyne Askew, MD;  Location: [facility];  Service: General;  Laterality: Right;   BREAST SURGERY     CHOLECYSTECTOMY  07/09/2007   lap choli   DIAGNOSTIC LAPAROSCOPY Right 05/25/2008   ectopic preg, partial right salpingectomy   FOOT ARTHRODESIS Right 05/12/2021   Procedure: RIGHT SUBTALAR  AND TALONAVICULAR FUSION;  Surgeon: Nadara Mustard, MD;  Location: [facility] OR;  Service: Orthopedics;  Laterality: Right;   ROBOTIC ASSISTED TOTAL HYSTERECTOMY WITH BILATERAL SALPINGO OOPHERECTOMY N/A 02/26/2023   Procedure: XI ROBOTIC ASSISTED TOTAL HYSTERECTOMY WITH BILATERAL SALPINGO OOPHORECTOMY;  Surgeon: Clide Cliff, MD;  Location: [facility];  Service: Gynecology;  Laterality: N/A;   SENTINEL NODE BIOPSY N/A 02/26/2023   Procedure: SENTINEL NODE BIOPSY;  Surgeon: Clide Cliff, MD;  Location: [facility];  Service: Gynecology;  Laterality: N/A;    Family History  Problem Relation Age of Onset   Hypertension Mother    Basal cell carcinoma Mother    Hypertension Father    Melanoma Father    Breast cancer Maternal Aunt    Breast cancer Cousin        maternal   Lung cancer Maternal Uncle    Melanoma Maternal Uncle    Colon cancer Neg Hx    Pancreatic cancer Neg Hx    Prostate cancer Neg Hx    Ovarian cancer Neg Hx    Endometrial cancer Neg Hx     Social History   Socioeconomic History   Marital status: Married    Spouse name: Not on file   Number of children: Not on file   Years of education: Not on file   Highest education level: Not on file  Occupational History   Not on file  Tobacco Use   Smoking status: Never    Passive exposure: Never   Smokeless tobacco: Never  Vaping Use   Vaping status: Never Used  Substance and Sexual Activity   Alcohol use: No   Drug  use: No   Sexual activity: Not Currently  Other Topics Concern   Not on file  Social History Narrative   Not on file   Social Drivers of Health   Financial Resource Strain: Not on file  Food Insecurity: No Food Insecurity (02/18/2023)   Hunger Vital Sign    Worried About Running Out of Food in the Last Year: Never true    Ran Out of Food in the Last Year: Never true  Transportation Needs: No Transportation Needs (02/18/2023)   PRAPARE - Administrator, Civil Service (Medical): No    Lack of Transportation (Non-Medical): No  Physical Activity: Not on file  Stress: Not on file  Social Connections: Not on file    Current Medications:  Current Outpatient Medications:    acetaminophen (TYLENOL) 500 MG tablet, Take 1,000 mg by mouth every 6 (six) hours as needed for pain., Disp: , Rfl:    ALPRAZolam (XANAX) 0.25 MG tablet, Take 0.25 mg by mouth daily as needed for anxiety., Disp: , Rfl:    amLODipine-olmesartan (AZOR) 5-40 MG tablet, Take 1 tablet by mouth at bedtime., Disp: , Rfl:    benzonatate (TESSALON) 200 MG capsule, Take 200 mg by mouth Three times daily as needed for cough., Disp: , Rfl:    diclofenac Sodium (VOLTAREN) 1 % GEL, Apply 2 g topically daily as needed (pain.)., Disp: , Rfl:    hydrochlorothiazide (HYDRODIURIL) 25 MG tablet, Take 25 mg by mouth at bedtime., Disp: , Rfl:    ibuprofen (ADVIL,MOTRIN) 200 MG tablet, Take 400 mg by mouth every 8 (eight) hours as needed for mild pain (pain score 1-3) or moderate pain (pain score 4-6)., Disp: , Rfl:    omeprazole (PRILOSEC) 20 MG capsule, Take 20 mg by mouth daily as needed (heartburn/indigestion/cough)., Disp: , Rfl:    potassium chloride  SA (KLOR-CON M) 20 MEQ tablet, Take 2 tablets (40 mEq total) by mouth once for 1 dose., Disp: 2 tablet, Rfl: 0   senna-docusate (SENOKOT-S) 8.6-50 MG tablet, Take 2 tablets by mouth at bedtime. For AFTER surgery, do not take if having diarrhea, Disp: 30 tablet, Rfl: 0   traMADol  (ULTRAM) 50 MG tablet, Take 1 tablet (50 mg total) by mouth every 12 (twelve) hours as needed for severe pain (pain score 7-10). For AFTER surgery only, do not take and drive, Disp: 5 tablet, Rfl: 0  Review of Symptoms: Complete 10-system review is as Royce Macadamia for abdominal pain  Physical Exam: BP 115/61 (BP Location: Left Arm, Patient Position: Sitting)   Pulse 75   Temp 98.7 F (37.1 C) (Oral)   Resp 20   Wt 262 lb 3.2 oz (118.9 kg)   LMP  (LMP Unknown) Comment: Spotting only  SpO2 98%   BMI 42.32 kg/m  General: Alert, oriented, no acute distress. HEENT: Normocephalic, atraumatic. Neck symmetric without masses. Sclera anicteric.  Chest: Normal work of breathing. Clear to auscultation bilaterally.   Cardiovascular: Regular rate and rhythm, no murmurs. Abdomen: Soft, mildly TTP.  Normoactive bowel sounds.  No masses appreciated.  Well-healing incisions. Some aging bruising around the left lateral incision. Extremities: Grossly normal range of motion.  Warm, well perfused.  No edema bilaterally. Skin: No rashes or lesions noted. GU: Normal appearing external genitalia without erythema, excoriation, or lesions.  Speculum exam reveals sutures from vaginal laceration repair. Intact, well healing vaginal cuff.  Bimanual exam reveals intact vaginal cuff. Exam chaperoned by Kimberly [place], CMA   Laboratory & Radiologic Studies: Surgical pathology (02/26/23): A. SENTINEL LYMPH NODE, RIGHT, OBTURATOR:      One lymph node, negative for metastatic carcinoma (0/1).  B. SENTINEL LYMPH NODE, LEFT, EXTERNAL, ILIAC, VEIN:      One lymph node, negative for metastatic carcinoma (0/1).  C. UTERUS, CERVIX, BILATERAL TUBES AND OVARIES, TOTAL ABDOMINAL HYSTRECTOMY AND BILATERAL SALPIINGO-OOPHONECTOMY:      Endometrioid adenocarcinoma, FIGO grade 2.      Carcinoma invades 20% of myometrium (3 mm / 15 mm).      Lymphovascular invasion is not identified.      Background inactive endometrium.       Myometrium with adenomyosis.      Bilateral ovaries without diagnostic alteration.      Bilateral fimbriated fallopian tubes without diagnostic alteration.      See oncology table.  D. SIGMOID PERITONEAL, BIOPSY:      Benign fibrous nodule with giant cell reactions, calcification and necrotic squamous debris.      Negative for malignancy.

## 2023-03-11 NOTE — Addendum Note (Signed)
 Addended by: Clide Cliff on: 03/11/2023 05:13 PM   Modules accepted: Level of Service

## 2023-03-28 ENCOUNTER — Telehealth: Payer: Self-pay

## 2023-03-28 NOTE — Telephone Encounter (Signed)
 S/P Robotic-assisted total laparoscopic hysterectomy, bilateral salpingo-oophorectomy, bilateral sentinel lymph node evaluation and biopsy, vaginal laceration repair on 2/18 with Dr.Newton  Ms. [name] called office this morning stating she had a one time vaginal spotting last night on toilet tissue, nothing on pad this morning. Blood was a "salmon color" with no clots. No vaginal discharge, no odor. Nothing has been inserted in the vagina. She did laundry yesterday but nothing to strenuous.  Also, reports having rectal/vaginal discomfort. It is not constant and 5-7/10 on pain scale,when it does occur, relieved by Tylenol. Reports no constipation or UTI S&S. She is passing gas. No N/V, No SOB  Advised the spotting is still part of the healing process. Pt to monitor for heavy bleeding, changing pad every hour and pain not relieved by Tramadol.   Aware Dr.Newton is out of the office today, message sent and we will call her back with advice.

## 2023-03-28 NOTE — Telephone Encounter (Signed)
 Pt is aware of advice and recommendations from Warner Mccreedy NP. Pt voiced an understanding and will continue to monitor.

## 2023-04-05 ENCOUNTER — Telehealth: Payer: Self-pay | Admitting: Licensed Clinical Social Worker

## 2023-04-05 NOTE — Telephone Encounter (Signed)
 [facility] Clinical Social Work  Visual merchandiser received TC from patient with concerns regarding mood. Per pt, she is crying daily. It feels like it comes out of the blue and lasts for a short time.  Walking with her sister in the afternoons is somewhat helpful.  Since the crying is not decreasing and is bothering the pt, CSW offered counseling sessions. The soonest pt could do is 04/15/23. Scheduled for 11:30am.  CSW also discussed that, should pt want to explore medications for mood, that will be done through her medical doctors.     Lenord Fralix E Stefanie Hodgens, LCSW  Clinical Social Worker [facility]

## 2023-04-08 ENCOUNTER — Encounter: Payer: Self-pay | Admitting: Psychiatry

## 2023-04-08 ENCOUNTER — Telehealth: Payer: Self-pay | Admitting: *Deleted

## 2023-04-08 NOTE — Telephone Encounter (Signed)
 Spoke with Ms. Profitt and relayed message from Warner Mccreedy, NP that pt did have her ovaries removed at the time of surgery. A magnesium supplement may help with cramps, and also help with anxiety and sleep. Pt can reach out to her PCP and ask about something for the anxiety daily until getting through this time. Pt verbalized understanding.   Pt states she only sleeps 3 hours each night and when she goes back to work, she works midnight until 0400, sleeps 2 hours before and then only sleeps for a short time before she needs to get up for her son. Advised patient that decreased sleep can also lead to anxiety and increased cortisol and in combination with having ovaries removed. The bloating feeling after eating, due to drop in estrogen can cause slower digestion, advised smaller meals throughout the day. Pt states she is having daily bowel movements, no symptoms of nausea or vomiting, denies pain. Advised patient to keep [place] posted on her symptoms. Pt thanked the office for calling.

## 2023-04-11 ENCOUNTER — Encounter: Payer: Self-pay | Admitting: *Deleted

## 2023-04-15 ENCOUNTER — Inpatient Hospital Stay: Attending: Psychiatry | Admitting: Licensed Clinical Social Worker

## 2023-04-15 NOTE — Progress Notes (Signed)
 [facility] CSW Counseling Note  Patient was referred by self. Treatment type: Individual  Presenting Concerns: Patient and/or family reports the following symptoms/concerns: anxiety and stress Duration of problem: 2 months; Severity of problem: moderate   Orientation:oriented to person, place, time/date, and situation.   Affect: Appropriate, Congruent, and Tearful Risk of harm to self or others: No plan to harm self or others  Patient and/or Family's Strengths/Protective Factors: Concrete supports in place (healthy food, safe environments, etc.)Ability for insight  Communication skills  Motivation for treatment/growth  Religious Affiliation      Goals Addressed: Patient will:  Reduce symptoms of: anxiety Increase knowledge and/or ability of: coping skills  Increase healthy adjustment to current life circumstances   Progress towards Goals: Initial   Interventions: Interventions utilized:  CBT, Meditation: deep breathing, and Other: psychoeducation       Assessment: Patient currently experiencing anxiety, increased high emotion and crying moments and adjustment to physical and hormonal changes post hysterectomy for endometrial cancer. Pt did start lorazepam (from PCP) but felt it took her too much out of her body and made her too tired, so she halved the pill.  She is dealing with physical changes (hot flashes, etc) as well as fear of recurrence, survivor's guilt, and general anxiety.  CSW provided psychoeducation on anxiety and the body/ nervous system.  Practiced deep breathing to help with anxiety and hot flashes.  Utilized CBT methods to discuss allowing time to worry as well as noticing her thoughts.  Pt is also sleeping only a couple of hours at a time and spends hours in bed worrying/ thinking about other things although this has been the case for years.      Plan: Follow up with CSW: 1 week Behavioral recommendations: practice deep/ paced breathing 1-2x/day and when feeling  more anxious or having a hot flash.  Keep a notebook by your bed to write down thoughts when you start to worry. Then remind yourself that it is there for later and you do not need to think about it now. Continue your other coping strategies (walking, K-love, Bible verses, affirmations) Referral(s): Support group(s):  GYN group       Nixie Laube E Dwayne Begay, LCSW

## 2023-04-22 ENCOUNTER — Inpatient Hospital Stay: Admitting: Licensed Clinical Social Worker

## 2023-04-22 NOTE — Progress Notes (Signed)
 [facility] CSW Counseling Note  Patient was referred by self. Treatment type: Individual  Presenting Concerns: Patient and/or family reports the following symptoms/concerns: anxiety and stress Duration of problem: 2 months; Severity of problem: moderate   Orientation:oriented to person, place, time/date, and situation.   Affect: Appropriate and Congruent Risk of harm to self or others: No plan to harm self or others  Patient and/or Family's Strengths/Protective Factors: Concrete supports in place (healthy food, safe environments, etc.)Ability for insight  Communication skills  Motivation for treatment/growth  Religious Affiliation      Goals Addressed: Patient will:  Reduce symptoms of: anxiety Increase knowledge and/or ability of: coping skills  Increase healthy adjustment to current life circumstances   Progress towards Goals: Progressing   Interventions: Interventions utilized:  CBT, Meditation: deep breathing, and Other: psychoeducation       Assessment: Patient reports some improvement in managing anxiety symptoms and tearful episodes since last visit.  Primary trigger for tears was an article on survivor's guilt.  Main stressors are finances (disability issues, medical bills), and son graduating.  Pt successfully utilized deep breathing to assist in sleeping and reducing hot flashes. She was able to get into bed only when tired and had faster sleep onset and quicker return to sleep after utilizing breathing, journaling, and Bible study, resulting in more hours of sleep. This has improved how she is feeling.  More discussion today on stressors and CSW provided resources around medical and financial concerns.  Discussed noticing messaging to herself and whether those are thoughts or facts. Utilized CBT to give an example of changing a thought (ex: It is all my fault --> I was not aware and might have learned more before, and was not given all the information and am doing my best to  address it now).     Plan: Follow up with CSW: 1 week Behavioral recommendations: Continue deep/ paced breathing, journaling, and other coping strategies (walking, K-love, Bible verses, affirmations). Continue getting into bed only when tired.  Notice negative thoughts and whether they are facts or just thoughts. Referral(s): Support group(s):  GYN group .  Triage Health & Legal Aid       Yannick Steuber E Mickael Mcnutt, LCSW

## 2023-04-29 ENCOUNTER — Inpatient Hospital Stay: Admitting: Licensed Clinical Social Worker

## 2023-04-29 NOTE — Progress Notes (Signed)
 [facility] CSW Counseling Note  Patient was referred by self. Treatment type: Individual  Presenting Concerns: Patient and/or family reports the following symptoms/concerns: anxiety and stress Duration of problem: 2 months; Severity of problem: moderate   Orientation:oriented to person, place, time/date, and situation.   Affect: Appropriate, Congruent, and Tearful Risk of harm to self or others: No plan to harm self or others  Patient and/or Family's Strengths/Protective Factors: Concrete supports in place (healthy food, safe environments, etc.)Ability for insight  Communication skills  Motivation for treatment/growth  Religious Affiliation      Goals Addressed: Patient will:  Reduce symptoms of: anxiety Increase knowledge and/or ability of: coping skills  Increase healthy adjustment to current life circumstances   Progress towards Goals: Progressing   Interventions: Interventions utilized:  CBT, Meditation: deep breathing, and Other: psychoeducation       Assessment: Patient reports ongoing improvement in sleep, decreased tearful episodes (once this week and then today in session) and improved ability to do daily tasks. She has not needed to take her medication all week.  Tearful episode today was brought on by feeling sad for the other people waiting in the lobby for treatment and pt was able to say she also felt gratitude for her health.  Spent time today normalizing feeling more anxious or stressed around triggers (coming into the cancer center, scans, blood work, etc) and how to help accept and acknowledge those feelings and build in supports around those trigger times.  Practiced grounding with 5 senses and did a loving-kindness meditation and guided imagery today.    Plan: Follow up with CSW: 05/08/23 (will potentially switch to every 2 weeks if progress is sustained) Behavioral recommendations: Continue deep/ paced breathing, journaling, and other coping strategies (walking,  K-love, Bible verses, affirmations). Notice negative thoughts and whether they are facts or just thoughts.  Add in 5 senses for grounding if needed, acknowledging/naming your emotion, and giving grace. Referral(s): Support group(s):  GYN group .  Triage Health & Legal Aid. Power Thought card given 4/21       Deepak Bless E Enis Leatherwood, LCSW

## 2023-05-08 ENCOUNTER — Inpatient Hospital Stay: Admitting: Licensed Clinical Social Worker

## 2023-05-08 NOTE — Progress Notes (Signed)
 [facility] CSW Counseling Note  Patient was referred by self. Treatment type: Individual  Presenting Concerns: Patient and/or family reports the following symptoms/concerns: anxiety and stress Duration of problem: 2 months; Severity of problem: moderate   Orientation:oriented to person, place, time/date, and situation.   Affect: Appropriate, Congruent, and Tearful Risk of harm to self or others: No plan to harm self or others  Patient and/or Family's Strengths/Protective Factors: Concrete supports in place (healthy food, safe environments, etc.)Ability for insight  Communication skills  Motivation for treatment/growth  Religious Affiliation      Goals Addressed: Patient will:  Reduce symptoms of: anxiety Increase knowledge and/or ability of: coping skills  Increase healthy adjustment to current life circumstances   Progress towards Goals: Progressing   Interventions: Interventions utilized:  CBT, Meditation: deep breathing, and Other: psychoeducation       Assessment: Pt continues to have improved sleep and effective utilization of breathing and grounding skills.  She continues to journal and use her other coping skills as well. Now, tearful episodes have a specific trigger rather than being "out of the blue".  Main concern currently is the issue with social security/ [employer] which pt is trying to receive answers about but is having difficulty getting in touch with [employer] case worker and HR person.   Other main trigger is coming to/passing by the cancer center or talking to others who are still dealing with cancer (such as in support group).  Pt has deep empathy for others and accompanying sadness/ survivor's guilt.  Discussed acknowledging those feelings and utilizing "and" statements rather than "but" (I'm grateful I'm healthy but so sad for them  to "I'm sad they are dealing with cancer and I'm grateful for my health. I can offer prayer or healing thoughts towards them.)    Plan: Follow  up with CSW: 2 weeks Behavioral recommendations: Continue deep/ paced breathing, journaling, guided imagery, grounding, and other coping strategies (walking, K-love, Bible verses, affirmations). Notice triggers and accompanying thoughts. Write down "and" statements for those that recur regularly Referral(s): Support group(s):  GYN group .  [facility]. Power Thought card given 4/21       Amazin Pincock E Gaila Engebretsen, LCSW

## 2023-05-21 ENCOUNTER — Inpatient Hospital Stay: Attending: Psychiatry | Admitting: Licensed Clinical Social Worker

## 2023-05-21 NOTE — Progress Notes (Signed)
 [facility] CSW Counseling Note  Patient was referred by self. Treatment type: Individual  Presenting Concerns: Patient and/or family reports the following symptoms/concerns: anxiety and stress Duration of problem: 2 months; Severity of problem: moderate   Orientation:oriented to person, place, time/date, and situation.   Affect: Appropriate, Congruent, and Tearful Risk of harm to self or others: No plan to harm self or others  Patient and/or Family's Strengths/Protective Factors: Concrete supports in place (healthy food, safe environments, etc.)Ability for insight  Communication skills  Motivation for treatment/growth  Religious Affiliation      Goals Addressed: Patient will:  Reduce symptoms of: anxiety Increase knowledge and/or ability of: coping skills  Increase healthy adjustment to current life circumstances   Progress towards Goals: Progressing   Interventions: Interventions utilized:  CBT, Meditation: deep breathing, and Other: psychoeducation , PMR     Assessment: Pt reports continued tearfulness with specific triggers, but not out of the blue. She has had some worse sleep the last few days, possibly due to additional stressor with her son's health and worry about an appointment she had today with [facility] legal navigation.  CSW worked with pt on staying out of a vicious cycle of feeling stressed if there is a set back which then makes it more difficult to make progress. Discussed progress is not linear and reminded of how to utilize different tools and strategies (journaling, breathing, etc).  CSW taught pt progressive muscle relaxation today. Pt finds breathing more beneficial but may use parts of the PMR.    Pt did share some progress with the financial stressors- she received a hardship settlement on a medical bill and received information from [facility] about the back-pay that is pending.  She also spoke with legal navigation from [facility] who is looking into the  information provided today.    Plan: Follow up with CSW: 2 weeks Behavioral recommendations: Continue deep/ paced breathing, journaling, guided imagery, grounding, and other coping strategies (walking, K-love, Bible verses, affirmations). Notice triggers and accompanying thoughts. Write down "and" statements for those that recur regularly Referral(s): Support group(s):  GYN group.  [facility]. Power Thought card given 4/21       Athalie Newhard E Raahim Shartzer, LCSW

## 2023-06-04 ENCOUNTER — Inpatient Hospital Stay: Admitting: Licensed Clinical Social Worker

## 2023-06-04 NOTE — Progress Notes (Signed)
 [facility] CSW Counseling Note  Patient was referred by self. Treatment type: Individual  Presenting Concerns: Patient and/or family reports the following symptoms/concerns: anxiety and stress Duration of problem: 2 months; Severity of problem: moderate   Orientation:oriented to person, place, time/date, and situation.   Affect: Appropriate, Congruent, and Tearful Risk of harm to self or others: No plan to harm self or others  Patient and/or Family's Strengths/Protective Factors: Concrete supports in place (healthy food, safe environments, etc.)Ability for insight  Communication skills  Motivation for treatment/growth  Religious Affiliation      Goals Addressed: Patient will:  Reduce symptoms of: anxiety Increase knowledge and/or ability of: coping skills  Increase healthy adjustment to current life circumstances   Progress towards Goals: Progressing   Interventions: Interventions utilized:  CBT, Reframing, and Other: boundary setting      Assessment: Pt reports increased distress and crying in the last week after learning of 4-5 people with new cancer diagnoses (family members of co-workers, friends, etc) causing high stress. Pt does not set limits with conversations as she values being helpful to others. Discussed how empathy can be a strength and that she can only continue to be present for others if she sets appropriate boundaries and refills her own well.  Education on methods to maintain boundary, utilize relaxation and meditation skills to calm, and to add in enjoyable activities to refill the well.  Discussed strategies for boundary setting and limiting invitation to discuss distressing topics to begin implementing healthy interactions for pt.    Plan: Follow up with CSW: 1 week Behavioral recommendations: Continue deep/ paced breathing, journaling, guided imagery, grounding, and other coping strategies (walking, K-love, Bible verses, affirmations). Think about boundaries  and begin implementing. You can start active release like rubber band if needed Referral(s): Support group(s):  GYN group.  [facility]. Power Thought card given 4/21       [name] E Jasmine Laitinen, LCSW

## 2023-06-11 ENCOUNTER — Inpatient Hospital Stay: Admitting: Licensed Clinical Social Worker

## 2023-06-14 ENCOUNTER — Inpatient Hospital Stay: Attending: Psychiatry | Admitting: Licensed Clinical Social Worker

## 2023-06-14 NOTE — Progress Notes (Signed)
 [facility] CSW Counseling Note  Patient was referred by self. Treatment type: Individual  Presenting Concerns: Patient and/or family reports the following symptoms/concerns: anxiety and stress Duration of problem: 4 months; Severity of problem: moderate   Orientation:oriented to person, place, time/date, and situation.   Affect: Appropriate, Congruent, and Tearful Risk of harm to self or others: No plan to harm self or others  Patient and/or Family's Strengths/Protective Factors: Concrete supports in place (healthy food, safe environments, etc.)Ability for insight  Communication skills  Motivation for treatment/growth  Religious Affiliation      Goals Addressed: Patient will:  Reduce symptoms of: anxiety Increase knowledge and/or ability of: coping skills  Increase healthy adjustment to current life circumstances   Progress towards Goals: Progressing   Interventions: Interventions utilized:  CBT, Reframing, and Other: boundary setting , mindfulness     Assessment: Pt with continued stress impacting physical well-being, especially gut issues. CSW encouraged pt to also reach out to medical provider if in pain. Pt shared increased life stressors (ceiling leak, car issue, graduation, basketball nationals). Discussed feeling like she is not caring for others (a high value for her) by asking for less stress. CSW utilized CBT to explore evidence for thoughts and discussed "I statements" to reframe requests for assistance. Continued exploring setting boundaries to help protect her own mental well-being and, in turn, helping the household maintain calm.  Pt began some boundaries by not seeking out information on stress-inducing topics from co-workers and received good news from others regarding a few cancer diagnoses.   Provided mindfulness cards and pt chose "pick a color" and "brain dump".    Plan: Follow up with CSW: 06/24/23 Behavioral recommendations: Continue coping strategies. Add in  "pick a color" and do something you enjoy this week. Have a conversation at home using "I statements" to discuss how you feel, the cause, and what would help.  Referral(s): Support group(s):  GYN group.  [facility]. Power Thought card given 4/21, Mindfulness cards       Dezaree Tracey E Janeane Cozart, LCSW

## 2023-06-24 ENCOUNTER — Inpatient Hospital Stay: Admitting: Licensed Clinical Social Worker

## 2023-06-24 NOTE — Progress Notes (Signed)
 [facility] CSW Counseling Note  Patient was referred by self. Treatment type: Individual  Presenting Concerns: Patient and/or family reports the following symptoms/concerns: anxiety and stress Duration of problem: 4 months; Severity of problem: moderate   Orientation:oriented to person, place, time/date, and situation.   Affect: Appropriate, Congruent, and Tearful Risk of harm to self or others: No plan to harm self or others  Patient and/or Family's Strengths/Protective Factors: Concrete supports in place (healthy food, safe environments, etc.)Ability for insight  Communication skills  Motivation for treatment/growth  Religious Affiliation      Goals Addressed: Patient will:  Reduce symptoms of: anxiety Increase knowledge and/or ability of: coping skills  Increase healthy adjustment to current life circumstances   Progress towards Goals: Progressing   Interventions: Interventions utilized:  CBT and Other: boundary setting , mindfulness, ACT     Assessment: Pt's sleep quality and length decreased over the last week and crying increased (almost daily, about 10 min or less) with increased stressors. Pt had multiple events (son's graduation & party, other graduations, bridal showers, father's day) which increased stress. Pt continues to have abdominal pain and automatic thought that the cancer is back. She is also concerned for her dad who has melanoma. CSW utilized ACT and strategy of unhooking to help defuse worry thoughts. Additional education on approaching concerns in a non-judgmental way to prevent negative emotion escalation.   Pt was able to practice voicing her needs and feelings with I statements as she works on asking for more help at home. She voiced feeling less guilt and negative emotion when voicing her needs in that way.    Plan: Follow up with CSW: 1 week Behavioral recommendations: Continue coping strategies and I statements. Use thought defusion/ unhooking  strategy (I notice that I am having the thought that...). Make an appointment with your doctor for continued pain. Referral(s): Support group(s):  GYN group.  [facility]. Power Thought card given 4/21, Mindfulness cards       [name] E Jasmine Sand, LCSW

## 2023-07-02 ENCOUNTER — Inpatient Hospital Stay: Admitting: Licensed Clinical Social Worker

## 2023-07-02 NOTE — Progress Notes (Signed)
 [facility] CSW Counseling Note  Patient was referred by self. Treatment type: Individual. Pt's son present at beginning of visit  Presenting Concerns: Patient and/or family reports the following symptoms/concerns: anxiety and stress Duration of problem: 4 months; Severity of problem: moderate   Orientation:oriented to person, place, time/date, and situation.   Affect: Appropriate and Congruent Risk of harm to self or others: No plan to harm self or others  Patient and/or Family's Strengths/Protective Factors: Concrete supports in place (healthy food, safe environments, etc.)Ability for insight  Communication skills  Motivation for treatment/growth  Religious Affiliation      Goals Addressed: Patient will:  Reduce symptoms of: anxiety Increase knowledge and/or ability of: coping skills  Increase healthy adjustment to current life circumstances   Progress towards Goals: Progressing   Interventions: Interventions utilized:  CBT and Other: boundary setting , sleep hygiene     Assessment: Pt's son, Adine, present at beginning of visit. He expressed concern over pt's pain in abdomen as well as her stress and goal for her to relax more. Discussed how stress impacts the body and ways to work together as a family to address the biggest concerns for each of them. They will prioritize top 3 needs and set clear expectations about what they will look like fulfilled and when they need to be done.   Individually met with pt to further discuss coping. Pt was able to go to the beach with her sister for a day this weekend which was helpful for her. She continues to have poor sleep and is overextending with helping others.   Sleep hygiene/ sleep habits: - Pt works 3 nights/ week ( 12a-4a or 11p-3a). Naps prior to work for about 2 hours, then sleeps maybe 2-3 hours at some point in the morning. - May be doing laundry or watching the news when she returns and prior to going to sleep      Plan: Follow up with CSW: 1 week Behavioral recommendations:  Make an appointment with your doctor to follow-up on abdominal pain. Talk with Adine & Del to set top 3 priorities for household (ex: cleaning up items in living room), what complete will look like, and timeline for completion.Focus on those and give grace on other areas for now Sleep- set a 4 hour block where you will be able to sleep regardless of working or night (ex: 5a-9a). Do a relaxing activity before (not watching news) Referral(s): Support group(s):  GYN group.  [facility]. Power Thought card given 4/21, Mindfulness cards       Kianni Lheureux E Leaann Nevils, LCSW

## 2023-07-04 ENCOUNTER — Telehealth: Payer: Self-pay | Admitting: *Deleted

## 2023-07-04 NOTE — Telephone Encounter (Addendum)
 Spoke with Ms. Godown who called the office stating she is having a knot type pain in her abdomen on the left side. Pt is over 4 months post op. Pt states the pain started two months ago and the pain is dull on & off and originally it felt sharp at times, but the sharp pain is now gone. Pt reports when she is moving about she rates the pain at times 8/10 and when she is still the pain is 4/10.  The pain comes and goes and reports she doesn't feel the pain today.   Pt is taking advil and tylenol  daily. She also reports she has been constipated. Pt is taking 1 senokot daily but it doesn't seem to be working.Reports having a bowel movement every other day, but had three yesterday? Pt states she has daily nausea but no vomiting. Pt denies fever, chills, abnormal vaginal discharge, and no urinary symptoms. Pt also denies back pain. And no shortness of breath.   Advised patient to increase the senokot twice daily and start taking miralax 1 capful twice daily. Also advised patient if she is taking advil on a daily basis this could also be upsetting her stomach and make sure she is taking this with food, discussed long term use of NSAIDS. Pt instructed that the deep sutures on the left side under the breast takes the longest to dissolve and heal and continue to support that side and be cautious when lifting heavy items and reaching.   Also advised patient to keep herself well hydrated and follow up with her PCP. Also could also try alternating ice and heat on the area if pulled muscle related. Advised patient if her pain becomes worse and associated with vomiting, bleeding and other symptoms to go to the ED for evaluation. Advised patient that Dr. Eldonna is on vacation as well as NP message will be relayed to them on Monday and to call the office back with any immediate concerns. Pt verbalized understanding and thanked the office.

## 2023-07-08 ENCOUNTER — Other Ambulatory Visit: Payer: Self-pay | Admitting: Psychiatry

## 2023-07-08 DIAGNOSIS — R109 Unspecified abdominal pain: Secondary | ICD-10-CM

## 2023-07-08 DIAGNOSIS — C541 Malignant neoplasm of endometrium: Secondary | ICD-10-CM

## 2023-07-09 ENCOUNTER — Telehealth: Payer: Self-pay | Admitting: *Deleted

## 2023-07-09 ENCOUNTER — Inpatient Hospital Stay: Attending: Psychiatry | Admitting: Licensed Clinical Social Worker

## 2023-07-09 DIAGNOSIS — Z08 Encounter for follow-up examination after completed treatment for malignant neoplasm: Secondary | ICD-10-CM | POA: Insufficient documentation

## 2023-07-09 DIAGNOSIS — Z8542 Personal history of malignant neoplasm of other parts of uterus: Secondary | ICD-10-CM | POA: Insufficient documentation

## 2023-07-09 DIAGNOSIS — R140 Abdominal distension (gaseous): Secondary | ICD-10-CM | POA: Insufficient documentation

## 2023-07-09 DIAGNOSIS — Z90722 Acquired absence of ovaries, bilateral: Secondary | ICD-10-CM | POA: Insufficient documentation

## 2023-07-09 DIAGNOSIS — R194 Change in bowel habit: Secondary | ICD-10-CM | POA: Insufficient documentation

## 2023-07-09 DIAGNOSIS — Z9079 Acquired absence of other genital organ(s): Secondary | ICD-10-CM | POA: Insufficient documentation

## 2023-07-09 DIAGNOSIS — R6881 Early satiety: Secondary | ICD-10-CM | POA: Insufficient documentation

## 2023-07-09 DIAGNOSIS — Z90710 Acquired absence of both cervix and uterus: Secondary | ICD-10-CM | POA: Insufficient documentation

## 2023-07-09 NOTE — Telephone Encounter (Signed)
 Spoke with [name] and relayed message from Dr. Eldonna. Pt is scheduled for CT scan 7/14 at 4pm with arrival time of 2 pm at [facility]. And follow up appt. With Dr. Eldonna on Monday 7/28 at 2:30 pm. Pt agreed to date and time of appointments.   Spoke with patient again and relayed message from Dr. Eldonna that pt can be seen on Tuesday, July 8th.to discuss her symptoms and CT scan.  Pt agreed to an appointment on July 8 th at 0915. Advised patient the office will call her back tomorrow to confirm the time. Pt thanked the office for calling.

## 2023-07-09 NOTE — Progress Notes (Signed)
 [facility] CSW Counseling Note  Patient was referred by self. Treatment type: Individual  Presenting Concerns: Patient and/or family reports the following symptoms/concerns: anxiety and stress Duration of problem: 4 months; Severity of problem: moderate   Orientation:oriented to person, place, time/date, and situation.   Affect: Appropriate, Congruent, and Tearful Risk of harm to self or others: No plan to harm self or others  Patient and/or Family's Strengths/Protective Factors: Concrete supports in place (healthy food, safe environments, etc.)Ability for insight  Communication skills  Motivation for treatment/growth  Religious Affiliation      Goals Addressed: Patient will:  Reduce symptoms of: anxiety Increase knowledge and/or ability of: coping skills  Increase healthy adjustment to current life circumstances   Progress towards Goals: Progressing   Interventions: Interventions utilized:  CBT and Meditation: color healing     Assessment: Patient continues to deal with high stress around the physical pain she is experiencing. She is relieved to have appointments scheduled for CT and follow-up and has been trying to take her medication more consistently to help with GI issues. Pt is anxious about the scans and taking contrast. CSW and pt discussed strategies to deal with anxiety in that moment with deep breathing, color meditation, music/audio book to distract, and a support person. Pt also has a Xanax prescription that she may utilize.   Sleep- about the same. She did sleep for about 3 hours last night which is a long stretch for her  Continued to work on re-framing and recognizing progress and productivity rather than filtering out positives. Included psychoeducation on anxiety and the brain in response to threats (pain).      Plan: Follow up with CSW: 1 week- will continue plan to manage anxiety with scan Behavioral recommendations:  Recognize what you do each day instead of  focusing on what you were not able to get done. Talk with Adine & Del to set top 3 priorities for household (ex: cleaning up items in living room), what complete will look like, and timeline for completion.Focus on those and give grace on other areas for now Practice meditations and breathing exercises Referral(s): Support group(s):  GYN group.  [facility]. Power Thought card given 4/21, Mindfulness cards       Nailyn Dearinger E Tlaloc Taddei, LCSW

## 2023-07-09 NOTE — Telephone Encounter (Signed)
-----   Message from [place], [place] sent at 07/08/2023  6:33 PM EDT ----- Regarding: RE: Pain in abdomen Thanks so much for letting us  know. Sorry, was out of town last week.  Gyn onc team - can you see about getting this pt's follow-up moved up. I can order a CT a/p which she can get completed in the meantime.  Thanks, Hoy ----- Message ----- From: [name] Sent: 07/02/2023   3:21 PM EDT To: Hoy Masters, MD; Ami LITTIE Fess, RN Subject: Pain in abdomen                                I've been seeing this pt for anxiety & stress in survivorship and she has been having a good deal of pain lately in her abdomen and feels like there might be a knot there.  She was putting it off because she is trying to take care of everyone else but we discussed that she needs to be well herself if she is going to keep caring for others. I've encouraged her to call to schedule an appointment with you to follow-up (she said she will this week) and wanted to give you a heads up as well.     [name]

## 2023-07-10 NOTE — Telephone Encounter (Signed)
 Spoke with patient in regards to her appointment with Dr. Eldonna that we rescheduled for Monday, July 7th at 2:45. Pt agreed to date and time and thanked the office for calling.

## 2023-07-15 ENCOUNTER — Encounter: Payer: Self-pay | Admitting: Psychiatry

## 2023-07-15 ENCOUNTER — Inpatient Hospital Stay: Admitting: Psychiatry

## 2023-07-15 VITALS — BP 115/65 | HR 80 | Temp 98.6°F | Resp 20 | Wt 272.8 lb

## 2023-07-15 DIAGNOSIS — Z9079 Acquired absence of other genital organ(s): Secondary | ICD-10-CM | POA: Diagnosis not present

## 2023-07-15 DIAGNOSIS — R194 Change in bowel habit: Secondary | ICD-10-CM | POA: Diagnosis not present

## 2023-07-15 DIAGNOSIS — Z90722 Acquired absence of ovaries, bilateral: Secondary | ICD-10-CM | POA: Diagnosis not present

## 2023-07-15 DIAGNOSIS — Z8542 Personal history of malignant neoplasm of other parts of uterus: Secondary | ICD-10-CM

## 2023-07-15 DIAGNOSIS — Z08 Encounter for follow-up examination after completed treatment for malignant neoplasm: Secondary | ICD-10-CM | POA: Diagnosis present

## 2023-07-15 DIAGNOSIS — R140 Abdominal distension (gaseous): Secondary | ICD-10-CM

## 2023-07-15 DIAGNOSIS — R109 Unspecified abdominal pain: Secondary | ICD-10-CM

## 2023-07-15 DIAGNOSIS — K5900 Constipation, unspecified: Secondary | ICD-10-CM

## 2023-07-15 DIAGNOSIS — R6881 Early satiety: Secondary | ICD-10-CM | POA: Diagnosis not present

## 2023-07-15 DIAGNOSIS — C541 Malignant neoplasm of endometrium: Secondary | ICD-10-CM

## 2023-07-15 DIAGNOSIS — Z90710 Acquired absence of both cervix and uterus: Secondary | ICD-10-CM | POA: Diagnosis not present

## 2023-07-15 NOTE — Progress Notes (Signed)
 Gynecologic Oncology Return Clinic Visit  Date of Service: 07/15/2023 Referring Provider: Slater Door, MD   Assessment & Plan: [name] is a 58 y.o. 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  Endometrial cancer: - No high-intermediate risk factors. - Recommend observation. - NED on exam today - Given concern for abdominal firmness (not palpated by me on exam but reports by patient), change in bowel habits, report of bloating, early satiety - CT scan ordered for 07/22/23. If NED, okay to follow-up for 54mo - Signs/symptoms of recurrence reviewed. - Surveillance reviewed. Follow-up q6 months x2 years then yearly.   RTC 6 mo unless indicated sooner by CT scan  Hoy Masters, MD Gynecologic Oncology   Medical Decision Making I personally spent  TOTAL 30 minutes face-to-face and non-face-to-face in the care of this patient, which includes all pre, intra, and post visit time on the date of service.   ----------------------- Reason for Visit: Follow-up surveillance  Treatment History: Oncology History  Endometrial cancer (HCC)  01/22/2023 Initial Biopsy   Endometrium, biopsy -endometrioid adenocarcinoma, FIGO grade 1, with foci of squamous differentiation    01/22/2023 Initial Diagnosis   Endometrial cancer (HCC)   02/26/2023 Surgery   Robotic-assisted total laparoscopic hysterectomy, bilateral salpingo-oophorectomy, bilateral sentinel lymph node evaluation and biopsy, vaginal laceration repair    02/26/2023 Pathology Results   A. SENTINEL LYMPH NODE, RIGHT, OBTURATOR:      One lymph node, negative for metastatic carcinoma (0/1).  B. SENTINEL LYMPH NODE, LEFT, EXTERNAL, ILIAC, VEIN:      One lymph node, negative for metastatic carcinoma (0/1).  C. UTERUS, CERVIX, BILATERAL TUBES AND OVARIES,  TOTAL ABDOMINAL HYSTRECTOMY AND BILATERAL SALPIINGO-OOPHONECTOMY:      Endometrioid adenocarcinoma, FIGO grade 2.      Carcinoma invades 20% of myometrium (3 mm / 15 mm).      Lymphovascular invasion is not identified.      Background inactive endometrium.      Myometrium with adenomyosis.      Bilateral ovaries without diagnostic alteration.      Bilateral fimbriated fallopian tubes without diagnostic alteration.      See oncology table.  D. SIGMOID PERITONEAL, BIOPSY:      Benign fibrous nodule with giant cell reactions, calcification and necrotic squamous debris.      Negative for malignancy.  ONCOLOGY TABLE:  UTERUS, CARCINOMA OR CARCINOSARCOMA: Resection  Procedure: Total hysterectomy and bilateral salpingo-oophorectomy Histologic Type: Endometrioid adenocarcinoma Histologic Grade: FIGO grade 2 Myometrial Invasion:      Depth of Myometrial Invasion (mm): 3 mm      Myometrial Thickness (mm): 15 mm      Percentage of Myometrial Invasion: 20% Uterine Serosa Involvement: Not identified Cervical stromal Involvement: Not identified Extent of involvement of other tissue/organs: Not identified Peritoneal/Ascitic Fluid: Not done Lymphovascular Invasion: Not identified Regional Lymph Nodes:      Pelvic Lymph Nodes Examined:          [2] Sentinel          [0] Non-sentinel          [2] Total      Pelvic Lymph Nodes with Metastasis: 0          Macrometastasis: (>2.0 mm): 0          Micrometastasis: (>0.2 mm and < 2.0 mm): 0  Isolated Tumor Cells (<0.2 mm): 0          Laterality of Lymph Node with Tumor: NA          Extracapsular Extension: NA      Para-aortic Lymph Nodes Examined:          [0] Sentinel          0[]  Non-sentinel          [0] Total      Para-aortic Lymph Nodes with Metastasis: NA          Macrometastasis: (>2.0 mm): NA          Micrometastasis:  (>0.2 mm and < 2.0 mm): NA          Isolated Tumor Cells (<0.2 mm): NA          Laterality of Lymph Node  with Tumor: NA          Extracapsular Extension: NA Distant Metastasis:      Distant Site(s) Involved: Not applicable Pathologic Stage Classification (pTNM, AJCC 8th Edition): pT1a, pN0 Ancillary Studies: MMR / MSI testing will be ordered Representative Tumor Block: C5 Comment(s): See comment   COMMENT:  C. Immunohistochemical stain for p53 and ER were performed. ER shows  weakly positivity. P53 is wild type pattern of staining.   IHC EXPRESSION RESULTS  TEST           RESULT  MLH1:          Preserved nuclear expression  MSH2:          Preserved nuclear expression  MSH6:          Preserved nuclear expression  PMS2:          Preserved nuclear expression    02/26/2023 Cancer Staging   Staging form: Corpus Uteri - Carcinoma and Carcinosarcoma, AJCC 8th Edition and FIGO 2023 - Clinical stage from 02/26/2023: FIGO Stage IA2, calculated as Stage IA (cT1a, cN0, cM0, POLE: Not Assessed, MMRd-, p53-) - Signed by Eldonna Mays, MD on 03/11/2023 Histopathologic type: Endometrioid adenocarcinoma, NOS Stage prefix: Initial diagnosis Histologic grade (G): G2 Histologic grading system: 3 grade system     Interval History: Reports feeling a knot in the LUQ for the past 2 months. Near her incision but not exactly at the incision. Comes and goes. When present, feels hard. Feels it most when lifting heavy items. Reports alternating constipation and diarrhea. Reports that it is hard for the stool to come out, has to strain. Reports some early satiety, bloating, nausea for the past month but no vomiting. No new vaginal bleeding, unintentional weight loss, change in bladder habits. No blood in stools.   Past Medical/Surgical History: Past Medical History:  Diagnosis Date   Allergic rhinitis    Anxiety    Arthritis    Cancer (HCC)    endometrial cancer   Family history of adverse reaction to anesthesia    sisters slow to wake up   GERD (gastroesophageal reflux disease)    Hypertension    Panic  attacks    Peripheral vascular disease (HCC)    Seasonal allergies     Past Surgical History:  Procedure Laterality Date   ANTERIOR FUSION CERVICAL SPINE  2015   BREAST LUMPECTOMY WITH NEEDLE LOCALIZATION Right 07/30/2012   Procedure: RIGHT BREAST WIRE LOCALIZATION LUMPECTOMY ;  Surgeon: Deward GORMAN Curvin DOUGLAS, MD;  Location: [facility];  Service: General;  Laterality: Right;   BREAST SURGERY     CHOLECYSTECTOMY  07/09/2007  lap choli   DIAGNOSTIC LAPAROSCOPY Right 05/25/2008   ectopic preg, partial right salpingectomy   FOOT ARTHRODESIS Right 05/12/2021   Procedure: RIGHT SUBTALAR AND TALONAVICULAR FUSION;  Surgeon: Harden Jerona GAILS, MD;  Location: [facility] OR;  Service: Orthopedics;  Laterality: Right;   ROBOTIC ASSISTED TOTAL HYSTERECTOMY WITH BILATERAL SALPINGO OOPHERECTOMY N/A 02/26/2023   Procedure: XI ROBOTIC ASSISTED TOTAL HYSTERECTOMY WITH BILATERAL SALPINGO OOPHORECTOMY;  Surgeon: Eldonna Mays, MD;  Location: [facility];  Service: Gynecology;  Laterality: N/A;   SENTINEL NODE BIOPSY N/A 02/26/2023   Procedure: SENTINEL NODE BIOPSY;  Surgeon: Eldonna Mays, MD;  Location: [facility];  Service: Gynecology;  Laterality: N/A;    Family History  Problem Relation Age of Onset   Hypertension Mother    Basal cell carcinoma Mother    Hypertension Father    Melanoma Father    Breast cancer Maternal Aunt    Breast cancer Cousin        maternal   Lung cancer Maternal Uncle    Melanoma Maternal Uncle    Colon cancer Neg Hx    Pancreatic cancer Neg Hx    Prostate cancer Neg Hx    Ovarian cancer Neg Hx    Endometrial cancer Neg Hx     Social History   Socioeconomic History   Marital status: Married    Spouse name: Not on file   Number of children: Not on file   Years of education: Not on file   Highest education level: Not on file  Occupational History   Not on file  Tobacco Use   Smoking status: Never    Passive exposure: Never   Smokeless tobacco: Never  Vaping Use    Vaping status: Never Used  Substance and Sexual Activity   Alcohol use: No   Drug use: No   Sexual activity: Not Currently  Other Topics Concern   Not on file  Social History Narrative   Not on file   Social Drivers of Health   Financial Resource Strain: Not on file  Food Insecurity: No Food Insecurity (02/18/2023)   Hunger Vital Sign    Worried About Running Out of Food in the Last Year: Never true    Ran Out of Food in the Last Year: Never true  Transportation Needs: No Transportation Needs (02/18/2023)   PRAPARE - Administrator, Civil Service (Medical): No    Lack of Transportation (Non-Medical): No  Physical Activity: Not on file  Stress: Not on file  Social Connections: Not on file    Current Medications:  Current Outpatient Medications:    acetaminophen  (TYLENOL ) 500 MG tablet, Take 1,000 mg by mouth every 6 (six) hours as needed for pain., Disp: , Rfl:    ALPRAZolam (XANAX) 0.25 MG tablet, Take 0.25 mg by mouth daily as needed for anxiety., Disp: , Rfl:    amLODipine-olmesartan (AZOR) 5-40 MG tablet, Take 1 tablet by mouth at bedtime., Disp: , Rfl:    benzonatate (TESSALON) 200 MG capsule, Take 200 mg by mouth Three times daily as needed for cough., Disp: , Rfl:    hydrochlorothiazide (HYDRODIURIL) 25 MG tablet, Take 25 mg by mouth at bedtime., Disp: , Rfl:    ibuprofen (ADVIL,MOTRIN) 200 MG tablet, Take 400 mg by mouth every 8 (eight) hours as needed for mild pain (pain score 1-3) or moderate pain (pain score 4-6)., Disp: , Rfl:    omeprazole (PRILOSEC) 20 MG capsule, Take 20 mg by mouth daily as needed (heartburn/indigestion/cough)., Disp: ,  Rfl:    senna-docusate (SENOKOT-S) 8.6-50 MG tablet, Take 2 tablets by mouth at bedtime. For AFTER surgery, do not take if having diarrhea, Disp: 30 tablet, Rfl: 0   diclofenac Sodium (VOLTAREN) 1 % GEL, Apply 2 g topically daily as needed (pain.). (Patient not taking: Reported on 07/10/2023), Disp: , Rfl:    potassium  chloride SA (KLOR-CON  M) 20 MEQ tablet, Take 2 tablets (40 mEq total) by mouth once for 1 dose. (Patient not taking: Reported on 07/10/2023), Disp: 2 tablet, Rfl: 0   traMADol  (ULTRAM ) 50 MG tablet, Take 1 tablet (50 mg total) by mouth every 12 (twelve) hours as needed for severe pain (pain score 7-10). For AFTER surgery only, do not take and drive (Patient not taking: Reported on 07/10/2023), Disp: 5 tablet, Rfl: 0  Review of Symptoms: Complete 10-system review is positive for: Constipation, joint pain, headache, diarrhea, anxiety, muscle cramps, ringing in ears, hot flashes  Physical Exam: BP 115/65 (BP Location: Right Arm, Patient Position: Sitting)   Pulse 80   Temp 98.6 F (37 C) (Oral)   Resp 20   Wt 272 lb 12.8 oz (123.7 kg)   LMP  (LMP Unknown) Comment: Spotting only  SpO2 98%   BMI 44.03 kg/m  General: Alert, oriented, no acute distress. HEENT: Normocephalic, atraumatic. Neck symmetric without masses. Sclera anicteric.  Chest: Normal work of breathing. Clear to auscultation bilaterally.   Cardiovascular: Regular rate and rhythm, no murmurs. Abdomen: Soft, nontender.  Normoactive bowel sounds.  No masses appreciated.  Well-healed scar. Extremities: Grossly normal range of motion.  Warm, well perfused.  No edema bilaterally. Skin: No rashes or lesions noted. Lymphatics: No cervical, supraclavicular, or inguinal adenopathy. GU: Normal appearing external genitalia without erythema, excoriation, or lesions.  Speculum exam reveals normal vaginal mucosa, no lesions.  Bimanual exam reveals smooth cuff, no nodularity or pelvic mass.  Rectovaginal exam negatiev. Exam chaperoned by Kimberly Swaz[place], CMA    Laboratory & Radiologic Studies: none

## 2023-07-15 NOTE — Patient Instructions (Signed)
 It was a pleasure to see you in clinic today. - Exam is normal today. - Keep CT as scheduled - Return visit planned for 47month unless something on CT that we need to address sooner.  Thank you very much for allowing me to provide care for you today.  I appreciate your confidence in choosing our Gynecologic Oncology team at [facility].  If you have any questions about your visit today please call our office or send us  a MyChart message and we will get back to you as soon as possible.

## 2023-07-18 ENCOUNTER — Inpatient Hospital Stay: Admitting: Licensed Clinical Social Worker

## 2023-07-18 NOTE — Progress Notes (Signed)
 [facility] CSW Counseling Note  Patient was referred by self. Treatment type: Individual  Presenting Concerns: Patient and/or family reports the following symptoms/concerns: anxiety and stress Duration of problem: 4 months; Severity of problem: moderate   Orientation:oriented to person, place, time/date, and situation.   Affect: Appropriate and Congruent Risk of harm to self or others: No plan to harm self or others  Patient and/or Family's Strengths/Protective Factors: Concrete supports in place (healthy food, safe environments, etc.)Ability for insight  Communication skills  Motivation for treatment/growth  Religious Affiliation      Goals Addressed: Patient will:  Reduce symptoms of: anxiety Increase knowledge and/or ability of: coping skills  Increase healthy adjustment to current life circumstances   Progress towards Goals: Progressing   Interventions: Interventions utilized:  CBT and Meditation: beach imagery     Assessment: Patient reports a stressful week with family obligations and appointments for herself. She was able to see Dr. Eldonna as well as her PCP which has provided some relief. Pt felt guilt but did set appropriate boundaries with a neighbor.  Sleep has been difficult this week. Discussed the concept of total pain and how physical pain interacts with other areas of pain in our lives.  Created and reviewed plan to help pt manage her anxiety on Monday before & during her CT scan. Practiced beach imagery at the end of the session to support relaxation.      Plan: Follow up with CSW: 1 week Behavioral recommendations:  Utilize tools for anxiety during CT scan (mom with you, medication if needed, coloring/crosswords, meditations, deep breathing, devotions) Continue to work on boundaries Referral(s): Support group(s):  GYN group.  Triage Health & Legal Aid. Power Thought card given 4/21, Mindfulness cards       [name] Jasmine Juvia Aerts, LCSW

## 2023-07-19 ENCOUNTER — Other Ambulatory Visit: Admitting: Licensed Clinical Social Worker

## 2023-07-22 ENCOUNTER — Ambulatory Visit (HOSPITAL_COMMUNITY)

## 2023-07-22 ENCOUNTER — Encounter (HOSPITAL_COMMUNITY): Payer: Self-pay

## 2023-07-23 ENCOUNTER — Telehealth: Payer: Self-pay

## 2023-07-23 NOTE — Telephone Encounter (Signed)
 I reached out to [name]. She is aware her recent CT scan at [facility] was cancelled d/t insurance did not authorize it to be at [facility] (hospital based). Per authorization team [employer] usually pays for it to be done at a stand alone facility.   Pt usually has scans done at [facility] and will call them to schedule the CT scan.

## 2023-07-24 NOTE — Telephone Encounter (Signed)
 Per Ms.Langford, CT A/P has been scheduled on 7/22 @ [facility].

## 2023-07-26 ENCOUNTER — Inpatient Hospital Stay: Admitting: Licensed Clinical Social Worker

## 2023-07-26 NOTE — Progress Notes (Signed)
 [facility] CSW Counseling Note  Patient was referred by self. Treatment type: Individual  Presenting Concerns: Patient and/or family reports the following symptoms/concerns: anxiety and stress Duration of problem: 4 months; Severity of problem: moderate   Orientation:oriented to person, place, time/date, and situation.   Affect: Appropriate and Congruent Risk of harm to self or others: No plan to harm self or others  Patient and/or Family's Strengths/Protective Factors: Concrete supports in place (healthy food, safe environments, etc.)Ability for insight  Communication skills  Motivation for treatment/growth  Religious Affiliation      Goals Addressed: Patient will:  Reduce symptoms of: anxiety Increase knowledge and/or ability of: coping skills  Increase healthy adjustment to current life circumstances   Progress towards Goals: Progressing   Interventions: Interventions utilized:  CBT     Assessment: Patient has had some stress relieved this week. Her dad has a clean bill of health from his melanoma removal and son did not go on the trip to [facility]. Pt expressed noticing her body when getting stressed, identifying a coping tool, and then using it successfully throughout the week.  She was also able to prioritize her needs and take a [place] trip to [place] with her family. She continues to give to others, but recognizes what is in her power vs out of her control and is not giving beyond her means.   Pt's CT was moved to next week. Reviewed coping strategies for during & after the scan. Discussed fear of recurrence and what is typical over time.      Plan: Follow up with CSW: 1 week Behavioral recommendations:  Utilize tools for anxiety during CT scan (mom with you, medication if needed, coloring/crosswords, meditations, deep breathing, devotions) Continue to work on boundaries  Referral(s): Support group(s):  GYN group.  Triage Health & Legal Aid. Power Thought card given 4/21,  Mindfulness cards       Biran Mayberry E Bryan Omura, LCSW

## 2023-07-30 ENCOUNTER — Ambulatory Visit
Admission: RE | Admit: 2023-07-30 | Discharge: 2023-07-30 | Disposition: A | Discharge status: Home / Self Care | Source: Ambulatory Visit | Attending: Psychiatry | Admitting: Psychiatry

## 2023-07-30 DIAGNOSIS — C541 Malignant neoplasm of endometrium: Secondary | ICD-10-CM

## 2023-07-30 DIAGNOSIS — R109 Unspecified abdominal pain: Secondary | ICD-10-CM

## 2023-07-30 MED ORDER — IOPAMIDOL (ISOVUE-370) INJECTION 76%
100.0000 mL | Freq: Once | INTRAVENOUS | Status: AC | PRN
  Administered 2023-07-30: 100 mL via INTRAVENOUS

## 2023-08-02 ENCOUNTER — Inpatient Hospital Stay: Admitting: Licensed Clinical Social Worker

## 2023-08-02 NOTE — Progress Notes (Signed)
 [facility] CSW Counseling Note  Patient was referred by self. Treatment type: Individual  Presenting Concerns: Patient and/or family reports the following symptoms/concerns: anxiety and stress Duration of problem: 4 months; Severity of problem: moderate   Orientation:oriented to person, place, time/date, and situation.   Affect: Appropriate and Congruent Risk of harm to self or others: No plan to harm self or others  Patient and/or Family's Strengths/Protective Factors: Concrete supports in place (healthy food, safe environments, etc.)Ability for insight  Communication skills  Motivation for treatment/growth  Religious Affiliation      Goals Addressed: Patient will:  Reduce symptoms of: anxiety Increase knowledge and/or ability of: coping skills  Increase healthy adjustment to current life circumstances   Progress towards Goals: Progressing   Interventions: Interventions utilized:  CBT     Assessment: Patient presents with continued pain, but some decrease in stress since having her scans and reading her results yesterday. She is waiting to speak with the doctor about the medical interpretation and next steps.  Pt successfully utilized her color meditation, deep breathing, and beach imagery to drink the contrast and have the CT without using medication.    Some stressors at home/ with kids. Support around parenting stress and reactions from kids with the people they feel safe with.   Discussed FYNN class and pt is interested.      Plan: Follow up with CSW: 3 weeks due to time out of town for CSW & patient Behavioral recommendations:  Continue utilizing relaxation and coping skills and celebrating your progress Continue to work on boundaries  Speak with MD about scan & next steps Referral(s): Support group(s):  GYN group.  Triage Health & Legal Aid. Power Thought card given 4/21, Mindfulness cards; FYNN class       Rudolph Daoust E Mickael Mcnutt, LCSW

## 2023-08-05 ENCOUNTER — Ambulatory Visit: Payer: Self-pay | Admitting: Psychiatry

## 2023-08-05 ENCOUNTER — Ambulatory Visit: Admitting: Psychiatry

## 2023-08-19 ENCOUNTER — Inpatient Hospital Stay: Attending: Psychiatry | Admitting: Licensed Clinical Social Worker

## 2023-08-19 NOTE — Progress Notes (Signed)
 [facility] CSW Counseling Note  Patient was referred by self. Treatment type: Individual  Presenting Concerns: Patient and/or family reports the following symptoms/concerns: anxiety and stress Duration of problem: 4 months; Severity of problem: moderate   Orientation:oriented to person, place, time/date, and situation.   Affect: Appropriate and Congruent Risk of harm to self or others: No plan to harm self or others  Patient and/or Family's Strengths/Protective Factors: Concrete supports in place (healthy food, safe environments, etc.)Ability for insight  Communication skills  Motivation for treatment/growth  Religious Affiliation      Goals Addressed: Patient will:  Reduce symptoms of: anxiety Increase knowledge and/or ability of: coping skills  Increase healthy adjustment to current life circumstances   Progress towards Goals: Progressing   Interventions: Interventions utilized:  CBT     Assessment: Patient shared about her trip to the beach this past week. She had improved sleep, with sleeping through the night (with only bathroom awakenings). She has not taken ibuprofen in 1 week and feels she has had less pain, possibly from less bending and lifting. She sees PCP next week and will follow-up with him.  Pt has continued anxiety and about 3 tearful moments, one from relief, other unknown cause.  Pt was able to step away from an anxiety-inducing situation at a restaurant, go outside and utilize relaxation tools and then rejoined family with no issue.  CSW provided positive reinforcement of pt's ability to identify what happened in her body and take action to help herself. Discussed how this applies more widely and catching her thoughts when she begins to negate her progress.    Based on pt's progress with identifying triggers, noticing anxiety rising, utilizing coping skills (breathing, grounding, meditation), and caring for herself, encouraged pt to also speak with her PCP about if  there are medical options to assist with the hormonal and chemical factors that may be impacting mood, especially post-surgery.     Plan: Follow up with CSW: 2 weeks Behavioral recommendations:  Continue utilizing relaxation and coping skills and celebrating your progress Continue to work on boundaries  Speak with PCP about abdominal and ear pain and potential medications for mood Referral(s): Support group(s):  GYN group.  Triage Health & Legal Aid. Power Thought card given 4/21, Mindfulness cards; FYNN class       Jasmine Jeter E Caydyn Sprung, LCSW

## 2023-09-03 ENCOUNTER — Inpatient Hospital Stay: Admitting: Licensed Clinical Social Worker

## 2023-09-03 NOTE — Progress Notes (Signed)
 [facility] CSW Counseling Note  Patient was referred by self. Treatment type: Individual  Presenting Concerns: Patient and/or family reports the following symptoms/concerns: anxiety and stress Duration of problem: 4 months; Severity of problem: moderate   Orientation:oriented to person, place, time/date, and situation.   Affect: Appropriate and Congruent Risk of harm to self or others: No plan to harm self or others  Patient and/or Family's Strengths/Protective Factors: Concrete supports in place (healthy food, safe environments, etc.)Ability for insight  Communication skills  Motivation for treatment/growth  Religious Affiliation      Goals Addressed: Patient will:  Reduce symptoms of: anxiety Increase knowledge and/or ability of: coping skills  Increase healthy adjustment to current life circumstances   Progress towards Goals: Progressing   Interventions: Interventions utilized:  CBT     Assessment: Patient has had ongoing emotional moments and crying, sometimes with triggers, sometimes without obvious triggers. It often happens in the car when pt has down time to process different stressors and things happening in her life.  When moments are prompted, they seem to relate either to gratitude to be where she is now or to feeling disrespected or dismissed.  Discussed setting time intentionally to allow for processing and including journaling during that time. Reviewed a few journal prompts today.  Pt met with her PCP and will be having a colonoscopy as next step in relation to her abdominal pain. She has a renewed Xanax prescription as needed.  Sleep is on average 5 hrs in a 24 hr period (3 hrs overnight, 2 1 hour naps).     Plan: Follow up with CSW: 2 weeks. Pt also starting FYNN class on 9/9 Behavioral recommendations:  Continue utilizing relaxation and coping skills and celebrating your progress Continue to work on boundaries  Set time for processing and use your journal.  Consider prompt of writing about your reaction to the news of having cancer and what you learned from your experience. Referral(s): Support group(s):  GYN group.  Triage Health & Legal Aid. Power Thought card given 4/21, Mindfulness cards; FYNN class       Magin Balbi E Danicia Terhaar, LCSW

## 2023-09-16 ENCOUNTER — Ambulatory Visit: Admitting: Psychiatry

## 2023-09-19 ENCOUNTER — Inpatient Hospital Stay: Attending: Psychiatry | Admitting: Licensed Clinical Social Worker

## 2023-09-19 NOTE — Progress Notes (Signed)
 [facility] CSW Counseling Note  Patient was referred by self. Treatment type: Individual  Presenting Concerns: Patient and/or family reports the following symptoms/concerns: anxiety and stress Duration of problem: 4 months; Severity of problem: moderate   Orientation:oriented to person, place, time/date, and situation.   Affect: Appropriate and Congruent Risk of harm to self or others: No plan to harm self or others  Patient and/or Family's Strengths/Protective Factors: Concrete supports in place (healthy food, safe environments, etc.)Ability for insight  Communication skills  Motivation for treatment/growth  Religious Affiliation      Goals Addressed: Patient will:  Reduce symptoms of: anxiety Increase knowledge and/or ability of: coping skills  Increase healthy adjustment to current life circumstances Improve sleep quality and duration   Progress towards Goals: Progressing   Interventions: Interventions utilized:  CBT-I     Assessment: Patient has had similar crying episodes since last visit regardless of positive or negative emotion.  Sleep continues to be disrupted with most sleep coming from naps at different points in the day and night.  Pt does have the most energy at night when she is working or at home. She has worked 3rd shift for 10 years.  CSW provided education on CBT for insomnia today and provided patient with a sleep tracking log. We will work on sleep consolidation and thinking patterns around sleep at next session.    Plan: Follow up with CSW: 1 week Behavioral recommendations:  Continue utilizing relaxation and coping skills and celebrating your progress Use do not disturb on your phone when sleeping  Track your sleep on the handout and bring to next visit Referral(s): Support group(s):  GYN group.  Triage Health & Legal Aid. Power Thought card given 4/21, Mindfulness cards; FYNN class       Jacquetta Polhamus E Ryder Chesmore, LCSW

## 2023-09-25 ENCOUNTER — Inpatient Hospital Stay: Admitting: Licensed Clinical Social Worker

## 2023-09-25 NOTE — Progress Notes (Signed)
 [facility] CSW Counseling Note  Patient was referred by self. Treatment type: Individual  Presenting Concerns: Patient and/or family reports the following symptoms/concerns: anxiety and stress; insomnia Duration of problem: 4 months; Severity of problem: moderate   Orientation:oriented to person, place, time/date, and situation.   Affect: Appropriate and Congruent Risk of harm to self or others: No plan to harm self or others  Patient and/or Family's Strengths/Protective Factors: Concrete supports in place (healthy food, safe environments, etc.)Ability for insight  Communication skills  Motivation for treatment/growth  Religious Affiliation      Goals Addressed: Patient will:  Reduce symptoms of: anxiety Increase knowledge and/or ability of: coping skills  Increase healthy adjustment to current life circumstances Improve sleep quality and duration   Progress towards Goals: Progressing   Interventions: Interventions utilized:  CBT-I     Assessment: Patient brought in her sleep tracking diary for the last week. She had two work nights with 2 hours sleep. Additional nights were disrupted with 2-5 awakenings 15-45 min each. She had 3 days with naps about 30- min each.  Total sleep time (avg): 3.8 hours Time in bed (avg): 7 hours Sleep efficiency:  54.3%  Created sleep plan of bedtime 11pm, wake & out of bed at 4am (when husband gets up) for total TIB of 5 hrs. Made plan of what pt can do prior to bed, if she is awake for more than 20 min at night, and once she gets up in the morning.  For work nights, pt will continue bed at 8pm, up at 10:15pm and can nap 53min-1hr if she is unable to sleep again once she gets home.  CSW provided sleep tracking and thought log to complete & bring to next visit.    Plan: Follow up with CSW: 1 week Behavioral recommendations:  Follow set bedtime/wake time set today. Use good sleep hygiene before bed. Only use bed for sleep & sex. Do other things,  like phone calls, in a different room  Track your sleep on the handout and bring to next visit Referral(s): Support group(s):  GYN group.  Triage Health & Legal Aid. Power Thought card given 4/21, Mindfulness cards; FYNN class       Geral Coker E Adriana Lina, LCSW

## 2023-10-02 ENCOUNTER — Inpatient Hospital Stay: Admitting: Licensed Clinical Social Worker

## 2023-10-02 NOTE — Progress Notes (Signed)
 [facility] CSW Counseling Note  Patient was referred by self. Treatment type: Individual  Presenting Concerns: Patient and/or family reports the following symptoms/concerns: anxiety and stress; insomnia Duration of problem: 4 months; Severity of problem: moderate   Orientation:oriented to person, place, time/date, and situation.   Affect: Appropriate and Congruent Risk of harm to self or others: No plan to harm self or others  Patient and/or Family's Strengths/Protective Factors: Concrete supports in place (healthy food, safe environments, etc.)Ability for insight  Communication skills  Motivation for treatment/growth  Religious Affiliation      Goals Addressed: Patient will:  Reduce symptoms of: anxiety Increase knowledge and/or ability of: coping skills  Increase healthy adjustment to current life circumstances Improve sleep quality and duration   Progress towards Goals: Progressing   Interventions: Interventions utilized:  CBT-I     Assessment: Patient's sleep efficiency and total time improved since last visit. She is going to sleep when tired, leading to better quality sleep.  Pt has been feeling unwell the last few days which has had some impact.  Total sleep time (avg): 6.8 hrs Time in bed (avg): 7.2 hrs Sleep efficiency:  94%  Pt was able to also use the thought tracking form and create more helpful thoughts in sad or anger inducing situations, resulting in improved mood.    Pt made the connection that the teary moments, especially when hearing about someone else who has cancer, is linked to fear that her pain may be cancer. She thought corrects with a reminder that she has had a scan and does her deep breathing, but some anxiety remains. CSW normalized this and worked with pt to give herself grace and understanding that pain can cause this feeling. Encouraged pt to reach out to her PCP again.      Plan: Follow up with CSW: 2 weeks Behavioral recommendations:   Follow sleep hygiene & bedtime routine. Continue tracking sleep and only getting in bed when tired Continue thought tracking/ helpful thought replacement practice Continue your other coping skills Referral(s): Support group(s):  GYN group.  Triage Health & Legal Aid. Power Thought card given 4/21, Mindfulness cards; FYNN class       [name] E Jazon Jipson, LCSW

## 2023-10-16 ENCOUNTER — Inpatient Hospital Stay: Attending: Psychiatry | Admitting: Licensed Clinical Social Worker

## 2023-10-16 NOTE — Progress Notes (Signed)
 [facility] CSW Counseling Note  Patient was referred by self. Treatment type: Individual  Presenting Concerns: Patient and/or family reports the following symptoms/concerns: anxiety and stress; insomnia Duration of problem: 4 months; Severity of problem: moderate   Orientation:oriented to person, place, time/date, and situation.   Affect: Appropriate and Congruent Risk of harm to self or others: No plan to harm self or others  Patient and/or Family's Strengths/Protective Factors: Concrete supports in place (healthy food, safe environments, etc.)Ability for insight  Communication skills  Motivation for treatment/growth  Religious Affiliation      Goals Addressed: Patient will:  Reduce symptoms of: anxiety Increase knowledge and/or ability of: coping skills  Increase healthy adjustment to current life circumstances Improve sleep quality and duration- goal met   Progress towards Goals: Progressing   Interventions: Interventions utilized:  CBT-I ; meditation     Assessment: Patient's sleep efficiency and total time have remained fairly consistent since last visit. She continues to obtain about 6 hours of sleep in a 24 hr timeframe with fewer awakenings. Those 6 hours are still split into morning and night due to work schedule. This has helped overall, although pt reports more fatigue this week. She has also been busier through the week helping family and walking.   Pt reports decreased crying, although still having episodes and feeling more sensitive. She continues to deal with pain. Discussed and practiced a zoom out meditation for acute pain.    Plan: Follow up with CSW: 2 weeks Behavioral recommendations:  Continue sleep hygiene & routine. If you notice you start waking more, then track sleep/wake times again.  Write down when you have pain, what you were doing before, and what you did to try to help it. Give to your doctor Use zoom out meditation for acute pain or to provide  emotional space for yourself Referral(s): Support group(s):  GYN group.  Triage Health & Legal Aid. Power Thought card given 4/21, Mindfulness cards; FYNN class       Jasmine Raven E Dahmir Epperly, LCSW

## 2023-10-30 ENCOUNTER — Inpatient Hospital Stay: Admitting: Licensed Clinical Social Worker

## 2023-10-30 NOTE — Progress Notes (Signed)
 [facility] CSW Counseling Note  Patient was referred by self. Treatment type: Individual  Presenting Concerns: Patient and/or family reports the following symptoms/concerns: anxiety and stress; insomnia Duration of problem: 4 months; Severity of problem: moderate   Orientation:oriented to person, place, time/date, and situation.   Affect: Appropriate and Congruent Risk of harm to self or others: No plan to harm self or others  Patient and/or Family's Strengths/Protective Factors: Concrete supports in place (healthy food, safe environments, etc.)Ability for insight  Communication skills  Motivation for treatment/growth  Religious Affiliation      Goals Addressed: Patient will:  Reduce symptoms of: anxiety Increase knowledge and/or ability of: coping skills  Increase healthy adjustment to current life circumstances Improve sleep quality and duration- goal met   Progress towards Goals: Progressing   Interventions: Interventions utilized:  CBT, Solution Focused, and Strength-based     Assessment: Patient reports incrased physical pain in the last 2 weeks. She has tracked when it is higher, potential causes, and what she has done. It is now interfering more with sleep and activities.  CSW and patient talked about how to advocate for herself with her PCP's office and to give an update since it has been 2 months since her last appt there.  Otherwise, pt continues to make progress with all goals.  She has average of 6 hrs sleep/24 hours. Sometimes this is broken up, sometimes in a row (mostly on weekends). This helps with energy and mood during the day.  Pt also continues to make strides towards boundary setting for herself with what she can take on emotionally.  She has increased awareness of when her body is feeling overdone or too stressed and is utilizing coping skills.  Continued work today on CBT skills around thoughts of selfishness around getting sleep or setting boundaries.     Plan: Follow up with CSW: 3 weeks Behavioral recommendations:  Follow up with PCP to ask about getting colonoscopy scheduled and if there are other recommendations for next steps based on current pain Continue sleep hygiene & routine and setting boundaries for yourself Continue coping skills (breathing, meditations, 5 senses, walking, devotionals, music)  Referral(s): Support group(s):  GYN group.  Triage Health & Legal Aid. Power Thought card given 4/21, Mindfulness cards; FYNN class       Arek Spadafore E Abijah Roussel, LCSW

## 2023-11-11 ENCOUNTER — Encounter: Payer: Self-pay | Admitting: Radiology

## 2023-11-11 ENCOUNTER — Telehealth (HOSPITAL_BASED_OUTPATIENT_CLINIC_OR_DEPARTMENT_OTHER): Payer: Self-pay | Admitting: Orthopedic Surgery

## 2023-11-11 NOTE — Telephone Encounter (Signed)
 Patient states that she got a medication refill request from [facility] and she has not seen Dr Addie in over 2 years. Please advise best contact [phone]

## 2023-11-20 ENCOUNTER — Inpatient Hospital Stay: Attending: Psychiatry | Admitting: Licensed Clinical Social Worker

## 2023-11-20 NOTE — Progress Notes (Signed)
 [facility] CSW Counseling Note  Patient was referred by self. Treatment type: Individual  Presenting Concerns: Patient and/or family reports the following symptoms/concerns: anxiety and stress; insomnia Duration of problem: 4 months; Severity of problem: moderate   Orientation:oriented to person, place, time/date, and situation.   Affect: Appropriate and Congruent Risk of harm to self or others: No plan to harm self or others  Patient and/or Family's Strengths/Protective Factors: Concrete supports in place (healthy food, safe environments, etc.)Ability for insight  Communication skills  Motivation for treatment/growth  Religious Affiliation      Goals Addressed: Patient will:  Reduce symptoms of: anxiety Increase knowledge and/or ability of: coping skills  Increase healthy adjustment to current life circumstances Improve sleep quality and duration- goal met   Progress towards Goals: Progressing   Interventions: Interventions utilized:  CBT and Strength-based     Assessment: Patient reports sustained improvements with sleep. She still has pain, but it feels less in the last week or two. She is being more mindful of how much she is lifting and bending.    Pt described an occasion where she set a boundary and did not voluntarily over-extend herself (related to work and not volunteering an extra shift).  Utilizing CBT, CSW and patient discussed internal messaging pt has to herself around that event as well as with things like survivor's guilt.  Practiced using and statements and discussed how to notice the unhelpful thinking that discounts negative emotions and be able to acknowledge them and reframe instead.  Pt does have a consult appt in December to prepare for colonoscopy. She is anxious about colonoscopy and results.   Plan: Follow up with CSW: 3 weeks Behavioral recommendations:  Continue sleep hygiene & routine and setting boundaries for yourself Continue coping skills  (breathing, meditations, 5 senses, walking, devotionals, music)  Notice when you are having unkind or unrealistic thoughts to yourself. Try to reframe using the and statements and having the more helpful part 2nd (ex: I am feeling guilty about surviving when others didn't and I am grateful for the life I have) Referral(s): Support group(s):  GYN group.  Triage Health & Legal Aid. Power Thought card given 4/21, Mindfulness cards; FYNN class       Letti Towell E Karmela Bram, LCSW

## 2023-12-10 ENCOUNTER — Inpatient Hospital Stay: Attending: Psychiatry | Admitting: Licensed Clinical Social Worker

## 2023-12-10 NOTE — Progress Notes (Signed)
 [facility] CSW Counseling Note  Patient was referred by self. Treatment type: Individual  Presenting Concerns: Patient and/or family reports the following symptoms/concerns: anxiety and stress; insomnia Duration of problem: 4 months; Severity of problem: moderate   Orientation:oriented to person, place, time/date, and situation.   Affect: Appropriate, Congruent, and Tearful Risk of harm to self or others: No plan to harm self or others  Patient and/or Family's Strengths/Protective Factors: Concrete supports in place (healthy food, safe environments, etc.)Ability for insight  Communication skills  Motivation for treatment/growth  Religious Affiliation      Goals Addressed: Patient will:  Reduce symptoms of: anxiety Increase knowledge and/or ability of: coping skills  Increase healthy adjustment to current life circumstances Improve sleep quality and duration- goal met   Progress towards Goals: Progressing   Interventions: Interventions utilized:  CBT and Strength-based     Assessment: Patient reports increasing anxiety and tearfulness with upcoming 1 year anniversary of being told she likely had cancer and upcoming gyn/mammogram appt & colonoscopy. CSW provided psychoeducation and normalization of increased emotionality surrounding anniversaries. Discussed how to utilize coping skills to help manage the stress.  Practiced color healing, tree in a storm, and beach guided imageries today. Pt preferred color healing and beach.    Plan: Follow up with CSW: 2 weeks prior to medical appts Behavioral recommendations:  Continue coping skills (breathing, meditations, 5 senses, walking, devotionals, music). Utilize these and grounding skills during trip to [place] Give grace and normalize that anniversaries are a time of typically increased stress and nerves.  Referral(s): Support group(s):  GYN group.  Triage Health & Legal Aid. Power Thought card given 4/21, Mindfulness cards; FYNN class        [name] Jasmine Balthazar, LCSW

## 2023-12-23 ENCOUNTER — Inpatient Hospital Stay: Admitting: Licensed Clinical Social Worker

## 2023-12-23 NOTE — Progress Notes (Signed)
 [facility] CSW Counseling Note  Patient was referred by self. Treatment type: Individual *Visit conducted via telephone. Pt's identity confirmed.  Pt present in their home, CSW located at [facility]  Presenting Concerns: Patient and/or family reports the following symptoms/concerns: anxiety and stress; insomnia Duration of problem: 4 months; Severity of problem: moderate   Orientation:oriented to person, place, time/date, and situation.   Affect: Tearful Risk of harm to self or others: No plan to harm self or others  Patient and/or Family's Strengths/Protective Factors: Concrete supports in place (healthy food, safe environments, etc.)Ability for insight  Communication skills  Motivation for treatment/growth  Religious Affiliation      Goals Addressed: Patient will:  Reduce symptoms of: anxiety Increase knowledge and/or ability of: coping skills  Increase healthy adjustment to current life circumstances Improve sleep quality and duration- goal met   Progress towards Goals: Progressing   Interventions: Interventions utilized:  Meditation: body scan     Assessment: Patient has 1 year anniversary of diagnosis, upcoming doctor's appointments, and additional stressors of physically difficult (ankle) weekend in [place] and her son moving out abruptly for at least a few days.  Pt unable to come in person due to car issues this morning.  Pt tearful on the phone and is trying not to take her medicine.  She is using coping strategies but they are feeling less effective due to the amount of stressors. She was able to do devotionals, journal, and then go to sleep last night.  Practiced body scan meditation today to assist in releasing tension.  .    Plan: Follow up with CSW: 1 week Behavioral recommendations:  Continue coping skills (breathing, meditations, 5 senses, walking, devotionals, music). Add in body scan.  If anxiety is too high, consider taking your medicine Give grace and normalize  that anniversaries are a time of typically increased stress and nerves.  Remember that the wave passes Referral(s): Support group(s):  GYN group.  Triage Health & Legal Aid. Power Thought card given 4/21, Mindfulness cards; FYNN class       Daneen Volcy E Charmelle Soh, LCSW

## 2023-12-24 ENCOUNTER — Ambulatory Visit: Admitting: Gastroenterology

## 2023-12-24 ENCOUNTER — Encounter: Payer: Self-pay | Admitting: Gastroenterology

## 2023-12-24 VITALS — BP 122/76 | HR 86 | Ht 66.0 in | Wt 276.0 lb

## 2023-12-24 DIAGNOSIS — R1084 Generalized abdominal pain: Secondary | ICD-10-CM

## 2023-12-24 DIAGNOSIS — Z9049 Acquired absence of other specified parts of digestive tract: Secondary | ICD-10-CM | POA: Diagnosis not present

## 2023-12-24 DIAGNOSIS — R1013 Epigastric pain: Secondary | ICD-10-CM

## 2023-12-24 DIAGNOSIS — K529 Noninfective gastroenteritis and colitis, unspecified: Secondary | ICD-10-CM

## 2023-12-24 DIAGNOSIS — R1310 Dysphagia, unspecified: Secondary | ICD-10-CM | POA: Diagnosis not present

## 2023-12-24 DIAGNOSIS — K5900 Constipation, unspecified: Secondary | ICD-10-CM

## 2023-12-24 DIAGNOSIS — R194 Change in bowel habit: Secondary | ICD-10-CM

## 2023-12-24 DIAGNOSIS — K219 Gastro-esophageal reflux disease without esophagitis: Secondary | ICD-10-CM

## 2023-12-24 DIAGNOSIS — C541 Malignant neoplasm of endometrium: Secondary | ICD-10-CM

## 2023-12-24 DIAGNOSIS — R1319 Other dysphagia: Secondary | ICD-10-CM

## 2023-12-24 MED ORDER — ONDANSETRON HCL 4 MG PO TABS: 4.0000 mg | ORAL_TABLET | Freq: Three times a day (TID) | ORAL | 0 refills | Status: AC | PRN

## 2023-12-24 MED ORDER — NA SULFATE-K SULFATE-MG SULF 17.5-3.13-1.6 GM/177ML PO SOLN: 1.0000 | ORAL | 0 refills | Status: AC

## 2023-12-24 NOTE — Progress Notes (Signed)
 [name] [id number] 07/25/1965   Chief Complaint: Constipation, reflux  Referring Provider: Tisovec, Richard W, MD Primary GI MD: Sampson  HPI: [name] is a 58 y.o. female with past medical history of endometrial cancer s/p hysterectomy, anxiety, GERD, HTN, HLD, cholecystectomy who presents today for a complaint of constipation and reflux.    Patient referred by PCP for constipation and for 10-year colonoscopy.  Labs 06/22/2023 normal CBC, normal CMP aside from elevated blood glucose, high cholesterol, hemoglobin A1c 6.4  Follows with gynecologic oncology team for history of endometrial cancer.   Discussed the use of AI scribe software for clinical note transcription with the patient, who gave verbal consent to proceed.  History of Present Illness [name] is a 58 year old female who presents with chronic diarrhea and abdominal pain.   Chronic diarrhea - Diarrhea occurs almost daily, with up to three episodes per day. - Stool consistency varies from loose to watery. - Symptoms have persisted for years following cholecystectomy (prior to 2015). - No hematochezia or melena.  Abdominal pain - Upper abdominal pain began after endometrial cancer surgery in February 2025. - Pain described as a knot in the upper abdomen. - Exacerbated by bending or lifting heavy objects. - Intermittent in nature. - Associated with work activities involving frequent bending. - CT scan showed no abnormalities.  Constipation - History of constipation, particularly severe after endometrial cancer surgery. - Notable episode in November 2025 with three days without bowel movement. - Significant abdominal and rectal pain during constipation episodes. - Uses Miralax and stool softeners as needed.  Gastroesophageal reflux and dysphagia - History of acid reflux, managed with omeprazole as needed. - Reflux symptoms occur about once a week. - Occasional regurgitation of liquid into  mouth, especially when lying down. - Difficulty swallowing, particularly with chicken or bread, about once a month. - Dysphagia episodes are severe and frightening.   Previous GI Procedures/Imaging   CT A/P 07/30/2023 IMPRESSION: No acute findings.   Tiny right renal calculus. No evidence of ureteral calculi or hydronephrosis.   Mild hepatic steatosis.  Past Medical History:  Diagnosis Date   Allergic rhinitis    Anxiety    Arthritis    Cancer (HCC)    endometrial cancer   Family history of adverse reaction to anesthesia    sisters slow to wake up   GERD (gastroesophageal reflux disease)    Hypertension    Panic attacks    Peripheral vascular disease    Seasonal allergies     Past Surgical History:  Procedure Laterality Date   ANTERIOR FUSION CERVICAL SPINE  2015   BREAST LUMPECTOMY WITH NEEDLE LOCALIZATION Right 07/30/2012   Procedure: RIGHT BREAST WIRE LOCALIZATION LUMPECTOMY ;  Surgeon: Deward GORMAN Curvin DOUGLAS, MD;  Location: [facility];  Service: General;  Laterality: Right;   BREAST SURGERY     CHOLECYSTECTOMY  07/09/2007   lap choli   DIAGNOSTIC LAPAROSCOPY Right 05/25/2008   ectopic preg, partial right salpingectomy   FOOT ARTHRODESIS Right 05/12/2021   Procedure: RIGHT SUBTALAR AND TALONAVICULAR FUSION;  Surgeon: Harden Jerona GAILS, MD;  Location: [facility];  Service: Orthopedics;  Laterality: Right;   ROBOTIC ASSISTED TOTAL HYSTERECTOMY WITH BILATERAL SALPINGO OOPHERECTOMY N/A 02/26/2023   Procedure: XI ROBOTIC ASSISTED TOTAL HYSTERECTOMY WITH BILATERAL SALPINGO OOPHORECTOMY;  Surgeon: Eldonna Mays, MD;  Location: [facility];  Service: Gynecology;  Laterality: N/A;   SENTINEL NODE BIOPSY N/A 02/26/2023   Procedure: SENTINEL NODE BIOPSY;  Surgeon: Eldonna Mays, MD;  Location: [facility];  Service: Gynecology;  Laterality: N/A;    Current Outpatient Medications  Medication Sig Dispense Refill   acetaminophen  (TYLENOL ) 500 MG tablet Take 1,000 mg by mouth every 6  (six) hours as needed for pain.     ALPRAZolam (XANAX) 0.25 MG tablet Take 0.25 mg by mouth daily as needed for anxiety.     amLODipine-olmesartan (AZOR) 5-40 MG tablet Take 1 tablet by mouth at bedtime.     benzonatate (TESSALON) 200 MG capsule Take 200 mg by mouth Three times daily as needed for cough.     diclofenac Sodium (VOLTAREN) 1 % GEL Apply 2 g topically daily as needed (pain.).     hydrochlorothiazide (HYDRODIURIL) 25 MG tablet Take 25 mg by mouth at bedtime.     ibuprofen (ADVIL,MOTRIN) 200 MG tablet Take 400 mg by mouth every 8 (eight) hours as needed for mild pain (pain score 1-3) or moderate pain (pain score 4-6).     omeprazole (PRILOSEC) 20 MG capsule Take 20 mg by mouth daily as needed (heartburn/indigestion/cough).     senna-docusate (SENOKOT-S) 8.6-50 MG tablet Take 2 tablets by mouth at bedtime. For AFTER surgery, do not take if having diarrhea 30 tablet 0   No current facility-administered medications for this visit.    Allergies as of 12/24/2023 - Review Complete 12/24/2023  Allergen Reaction Noted   Hydrocodone  Anaphylaxis 08/20/2012   Avelox [moxifloxacin hcl in nacl]  02/28/2011   Codeine  02/28/2011    Family History  Problem Relation Age of Onset   Hypertension Mother    Basal cell carcinoma Mother    Hypertension Father    Melanoma Father    Breast cancer Maternal Aunt    Breast cancer Cousin        maternal   Lung cancer Maternal Uncle    Melanoma Maternal Uncle    Colon cancer Neg Hx    Pancreatic cancer Neg Hx    Prostate cancer Neg Hx    Ovarian cancer Neg Hx    Endometrial cancer Neg Hx     Social History[1]   Review of Systems:    Constitutional: No weight loss, fever, chills Cardiovascular: No chest pain Respiratory: No SOB  Gastrointestinal: See HPI and otherwise negative   Physical Exam:  Vital signs: BP 122/76 (BP Location: Left Arm, Patient Position: Sitting, Cuff Size: Large)   Pulse 86   Ht 5' 6 (1.676 m)   Wt 276 lb (125.2  kg)   LMP  (LMP Unknown) Comment: Spotting only  BMI 44.55 kg/m   Wt Readings from Last 3 Encounters:  12/24/23 276 lb (125.2 kg)  07/15/23 272 lb 12.8 oz (123.7 kg)  03/11/23 262 lb 3.2 oz (118.9 kg)    Constitutional: Pleasant, obese female in NAD, alert and cooperative Head:  Normocephalic and atraumatic.  Respiratory: Respirations even and unlabored. Lungs clear to auscultation bilaterally.  No wheezes, crackles, or rhonchi.  Cardiovascular:  Regular rate and rhythm. No murmurs. No peripheral edema. Gastrointestinal:  Soft, nondistended, tender to palpation of LUQ. No rebound or guarding. Normal bowel sounds. No appreciable masses or hepatomegaly. Rectal:  Not performed.  Neurologic:  Alert and oriented x4;  grossly normal neurologically.  Skin:   Dry and intact without significant lesions or rashes. Psychiatric: Oriented to person, place and time. Demonstrates good judgement and reason without abnormal affect or behaviors.   RELEVANT LABS AND IMAGING: CBC    Component Value Date/Time   WBC 7.5 02/13/2023 0825   RBC  5.17 (H) 02/13/2023 0825   HGB 14.9 02/13/2023 0825   HCT 46.1 (H) 02/13/2023 0825   PLT 310 02/13/2023 0825   MCV 89.2 02/13/2023 0825   MCH 28.8 02/13/2023 0825   MCHC 32.3 02/13/2023 0825   RDW 13.8 02/13/2023 0825   LYMPHSABS 2.5 02/14/2009 2112   MONOABS 0.5 02/14/2009 2112   EOSABS 0.1 02/14/2009 2112   BASOSABS 0.0 02/14/2009 2112    CMP     Component Value Date/Time   NA 138 02/26/2023 0835   K 3.4 (L) 02/26/2023 0835   CL 101 02/26/2023 0835   CO2 26 02/26/2023 0835   GLUCOSE 133 (H) 02/26/2023 0835   BUN 13 02/26/2023 0835   CREATININE 0.71 02/26/2023 0835   CALCIUM 9.6 02/26/2023 0835   PROT 7.7 02/13/2023 0825   ALBUMIN  4.0 02/13/2023 0825   AST 22 02/13/2023 0825   ALT 29 02/13/2023 0825   ALKPHOS 90 02/13/2023 0825   BILITOT 1.6 (H) 02/13/2023 0825   GFRNONAA >60 02/26/2023 0835   GFRAA >90 07/25/2012 1445      Assessment/Plan:   Assessment & Plan Chronic diarrhea after cholecystectomy Change in bowel habits Constipation  Generalized abdominal pain Chronic diarrhea post-cholecystectomy since 2015 with variable stool consistency.  In the last year has had intermittent episodes of severe constipation with associated abdominal pain and rectal pain.  Denies rectal bleeding.  Most recent episode of constipation was last month.  - Increase dietary fiber  - Suggest fiber supplements such as Benefiber or Metamucil. - Schedule colonoscopy. I thoroughly discussed the procedure with the patient to include nature of the procedure, alternatives, benefits, and risks (including but not limited to bleeding, infection, perforation, anesthesia/cardiac/pulmonary complications). Patient verbalized understanding and gave verbal consent to proceed with procedure.  - Request past colonoscopy records. - Will give limited prescription for Zofran  due to concerns regarding nausea/vomiting with bowel prep.  Upper abdominal pain Dysphagia Gastroesophageal reflux disease Intermittent dysphagia with solids, suggestive of esophageal stricture or dysmotility. Reflux symptoms weekly, managed with as-needed omeprazole. Occasional regurgitation noted. Intermittent upper abdominal pain possibly musculoskeletal, exacerbated by bending/lifting. Differential includes esophageal/stomach issues due to reflux/dysphagia history.  - Schedule upper endoscopy. I thoroughly discussed the procedure with the patient to include nature of the procedure, alternatives, benefits, and risks (including but not limited to bleeding, infection, perforation, anesthesia/cardiac/pulmonary complications). Patient verbalized understanding and gave verbal consent to proceed with procedure.  - Continue as-needed omeprazole.    Camie Furbish, PA-C [facility] 12/24/2023, 9:46 AM  Patient Care Team: Tisovec, Charlie ORN, MD as PCP - General  (Internal Medicine)       [1]  Social History Tobacco Use   Smoking status: Never    Passive exposure: Never   Smokeless tobacco: Never  Vaping Use   Vaping status: Never Used  Substance Use Topics   Alcohol use: No   Drug use: No

## 2023-12-24 NOTE — Patient Instructions (Signed)
 You have been scheduled for an endoscopy and colonoscopy. Please follow the written instructions given to you at your visit today.  If you use inhalers (even only as needed), please bring them with you on the day of your procedure.  DO NOT TAKE 7 DAYS PRIOR TO TEST- Trulicity (dulaglutide) Ozempic, Wegovy (semaglutide) Mounjaro, Zepbound (tirzepatide) Bydureon Bcise (exanatide extended release)  DO NOT TAKE 1 DAY PRIOR TO YOUR TEST Rybelsus (semaglutide) Adlyxin (lixisenatide) Victoza (liraglutide) Byetta (exanatide) ___________________________________________________________________________  PLEASE MAKE SURE AND GET US  YOUR NEW INSURANCE INFORMATION.   We have sent the following medications to your pharmacy for you to pick up at your convenience: Ondansetron   We will request your records from Dr Rollin to review.  I appreciate the opportunity to care for you. Camie Furbish, PA

## 2023-12-31 ENCOUNTER — Inpatient Hospital Stay: Admitting: Licensed Clinical Social Worker

## 2023-12-31 NOTE — Progress Notes (Signed)
 [facility] CSW Counseling Note  Patient was referred by self. Treatment type: Individual   Presenting Concerns: Patient and/or family reports the following symptoms/concerns: anxiety and stress; insomnia Duration of problem: 4 months; Severity of problem: moderate   Orientation:oriented to person, place, time/date, and situation.   Affect: Appropriate, Congruent, and Tearful Risk of harm to self or others: No plan to harm self or others  Patient and/or Family's Strengths/Protective Factors: Concrete supports in place (healthy food, safe environments, etc.)Ability for insight  Communication skills  Motivation for treatment/growth  Religious Affiliation      Goals Addressed: Patient will:  Reduce symptoms of: anxiety Increase knowledge and/or ability of: coping skills  Increase healthy adjustment to current life circumstances Improve sleep quality and duration- goal met   Progress towards Goals: Progressing   Interventions: Interventions utilized:  CBT and Supportive     Assessment: Patient had her appointments with GI and GYN and felt relief afterward with good reports so far. She is scheduled for colonoscopy & endoscopy to further explore GI issues.  Pt was able to utilize her breathing skills to help manage during appointments.  Pt continues to have stress at home and is trying to manage that for herself. Utilized CBT to explore pt's self-talk and blame to work towards understanding of appropriateness in the boundaries and requests she has made this past year.  Discussed Christmas and alternate ways to engage to minimize stress.    Plan: Follow up with CSW: 2 weeks. Will meet again after her testing and then discuss transfer of care to community therapist to continue addressing non-cancer related concerns Behavioral recommendations:  Continue coping skills (breathing, meditations, 5 senses, walking, devotionals, music). Add in body scan.  If anxiety is too high, consider taking  your medicine Give grace and normalize that anniversaries are a time of typically increased stress and nerves.  Referral(s): Support group(s):  GYN group.  Triage Health & Legal Aid. Power Thought card given 4/21, Mindfulness cards; FYNN class       Jasmine Cabanilla E Deshan Hemmelgarn, LCSW

## 2024-01-17 ENCOUNTER — Inpatient Hospital Stay: Attending: Psychiatry | Admitting: Licensed Clinical Social Worker

## 2024-01-17 NOTE — Progress Notes (Signed)
 [facility] CSW Counseling Note  Patient was referred by self. Treatment type: Individual   Presenting Concerns: Patient and/or family reports the following symptoms/concerns: anxiety and stress; insomnia Duration of problem: 4 months; Severity of problem: moderate   Orientation:oriented to person, place, time/date, and situation.   Affect: Appropriate, Congruent, and Tearful Risk of harm to self or others: No plan to harm self or others  Patient and/or Family's Strengths/Protective Factors: Concrete supports in place (healthy food, safe environments, etc.)Ability for insight  Communication skills  Motivation for treatment/growth  Religious Affiliation      Goals Addressed: Patient will:  Reduce symptoms of: anxiety Increase knowledge and/or ability of: coping skills  Increase healthy adjustment to current life circumstances Improve sleep quality and duration- goal met   Progress towards Goals: Progressing   Interventions: Interventions utilized:  CBT and Supportive     Assessment: Patient reports a strenuous 2 weeks with holidays, multiple family health issues (husband's knee, son's sprain or possible fracture), and many appointments. This has impacted pt's sleep and mood. Trauma from cancer was also increased as it nears the 1 year anniversary and she received a MyChart message that only linked to her old results. Utilized some reframing and noticing of thoughts versus facts today.  Allowed space for processing interpersonal dynamics as well.     Plan: Follow up with CSW: 3 weeks after colon/endoscopy. Will explore referring for treatment such as EMDR for additional support. Behavioral recommendations:  Continue coping skills (breathing, meditations, 5 senses, walking, devotionals, music). Add in body scan.  If anxiety is too high, consider taking your medicine Give grace and normalize that anniversaries are a time of typically increased stress and nerves.  Referral(s): Support  group(s):  GYN group.  Triage Health & Legal Aid. Power Thought card given 4/21, Mindfulness cards; FYNN class       Jasmine Senegal E Ashawn Rinehart, LCSW

## 2024-01-21 ENCOUNTER — Telehealth: Payer: Self-pay | Admitting: Gastroenterology

## 2024-01-21 NOTE — Telephone Encounter (Signed)
 Patient is scheduled for a colonoscopy 1/26 and was recently put on antibitics for a sinus infection; prednisone  and amoxicillin. She is concerned that she will need to reschedule. Please advise.

## 2024-01-22 NOTE — Telephone Encounter (Signed)
 Spoke with the patient. Moved her procedure date to 02/25/24 at 7:30 am. Patient is advised and agrees to this plan of care. No further questions at this time.

## 2024-01-22 NOTE — Telephone Encounter (Signed)
 Started on Augmentin and prednisone  01/20/24. Do you want her to reschedule?

## 2024-01-22 NOTE — Telephone Encounter (Signed)
 Agree with reschedule in 2 weeks or later. Thanks

## 2024-02-03 ENCOUNTER — Encounter: Admitting: Gastroenterology

## 2024-02-07 ENCOUNTER — Inpatient Hospital Stay: Admitting: Licensed Clinical Social Worker

## 2024-02-07 NOTE — Progress Notes (Signed)
 [facility] CSW Counseling Note  Patient was referred by self. Treatment type: Individual   Presenting Concerns: Patient and/or family reports the following symptoms/concerns: anxiety and stress; insomnia Duration of problem: 4 months; Severity of problem: moderate   Orientation:oriented to person, place, time/date, and situation.   Affect: Appropriate, Congruent, and Tearful Risk of harm to self or others: No plan to harm self or others  Patient and/or Family's Strengths/Protective Factors: Concrete supports in place (healthy food, safe environments, etc.)Ability for insight  Communication skills  Motivation for treatment/growth  Religious Affiliation      Goals Addressed: Patient will:  Reduce symptoms of: anxiety Increase knowledge and/or ability of: coping skills  Increase healthy adjustment to current life circumstances Improve sleep quality and duration- goal met   Progress towards Goals: Progressing   Interventions: Interventions utilized:  CBT and Supportive     Assessment: Patient continues to have increase in anxiety due to multiple health and family stressors. Sleep is decreased due to this.  Pt is trying breathing, meditations, journaling, and other supportive activities still.  Discussed ways to tweak strategies for extra assistance during this time (using audio meditation vs doing it herself, writing lists and checking off vs by memory or on her phone).   Discussed next steps for referral for ongoing counseling to help support through non-cancer stressors.    Plan: Follow up with CSW: 3 weeks (after colon/endoscopy).  Behavioral recommendations:  Continue coping skills (breathing, meditations, 5 senses, walking, devotionals, music). Do meditation with audio support.  If anxiety is too high, consider taking your medicine Contact insurance for EAP counselor list. Bring to next appt. Look at list provided by CSW today Referral(s): Support group(s):  GYN group.  Triage  Health & Legal Aid. Power Thought card given 4/21, Mindfulness cards; FYNN class       Joson Sapp E Tegan Britain, LCSW

## 2024-02-25 ENCOUNTER — Encounter: Admitting: Gastroenterology

## 2024-02-28 ENCOUNTER — Inpatient Hospital Stay: Attending: Psychiatry | Admitting: Licensed Clinical Social Worker

## 2024-07-20 ENCOUNTER — Inpatient Hospital Stay: Admitting: Psychiatry
